# Patient Record
Sex: Female | Born: 1985 | Marital: Single | Smoking: Never smoker
Health system: Southern US, Community
[De-identification: ages and names within clinical notes are randomized; demographics above are authoritative.]

## PROBLEM LIST (undated history)

## (undated) DIAGNOSIS — B2790 Infectious mononucleosis, unspecified without complication: Secondary | ICD-10-CM

## (undated) DIAGNOSIS — N200 Calculus of kidney: Secondary | ICD-10-CM

## (undated) DIAGNOSIS — M159 Polyosteoarthritis, unspecified: Secondary | ICD-10-CM

## (undated) HISTORY — PX: NEPHROSTOMY: SHX1014

## (undated) HISTORY — DX: Infectious mononucleosis, unspecified without complication: B27.90

## (undated) HISTORY — PX: CHOLECYSTECTOMY: SHX55

## (undated) HISTORY — DX: Polyosteoarthritis, unspecified: M15.9

## (undated) HISTORY — DX: Calculus of kidney: N20.0

---

## 2005-04-08 DIAGNOSIS — B2790 Infectious mononucleosis, unspecified without complication: Secondary | ICD-10-CM

## 2005-04-08 HISTORY — DX: Infectious mononucleosis, unspecified without complication: B27.90

## 2005-04-20 ENCOUNTER — Ambulatory Visit: Payer: Self-pay | Admitting: Family Medicine

## 2005-04-23 ENCOUNTER — Ambulatory Visit: Payer: Self-pay | Admitting: Family Medicine

## 2005-07-05 ENCOUNTER — Ambulatory Visit: Payer: Self-pay | Admitting: Family Medicine

## 2005-09-06 ENCOUNTER — Encounter: Payer: Self-pay | Admitting: Family Medicine

## 2005-09-11 ENCOUNTER — Ambulatory Visit: Payer: Self-pay | Admitting: Family Medicine

## 2005-09-11 ENCOUNTER — Encounter: Payer: Self-pay | Admitting: Family Medicine

## 2005-09-11 ENCOUNTER — Other Ambulatory Visit: Admission: RE | Admit: 2005-09-11 | Discharge: 2005-09-11 | Payer: Self-pay | Admitting: Family Medicine

## 2006-02-21 ENCOUNTER — Ambulatory Visit: Payer: Self-pay | Admitting: Family Medicine

## 2006-03-08 ENCOUNTER — Ambulatory Visit: Payer: Self-pay | Admitting: Family Medicine

## 2006-04-16 DIAGNOSIS — N943 Premenstrual tension syndrome: Secondary | ICD-10-CM | POA: Insufficient documentation

## 2006-04-24 ENCOUNTER — Ambulatory Visit: Payer: Self-pay | Admitting: Family Medicine

## 2006-05-24 ENCOUNTER — Encounter: Payer: Self-pay | Admitting: Family Medicine

## 2006-07-04 ENCOUNTER — Ambulatory Visit: Payer: Self-pay | Admitting: Family Medicine

## 2006-07-08 ENCOUNTER — Telehealth: Payer: Self-pay | Admitting: Family Medicine

## 2006-07-10 ENCOUNTER — Ambulatory Visit: Payer: Self-pay | Admitting: Family Medicine

## 2006-07-10 LAB — CONVERTED CEMR LAB
Bilirubin Urine: NEGATIVE
Glucose, Urine, Semiquant: NEGATIVE
Ketones, urine, test strip: NEGATIVE
Specific Gravity, Urine: 1.02
Urobilinogen, UA: 0.2
pH: 8

## 2006-07-11 ENCOUNTER — Telehealth: Payer: Self-pay | Admitting: Family Medicine

## 2006-07-12 ENCOUNTER — Telehealth (INDEPENDENT_AMBULATORY_CARE_PROVIDER_SITE_OTHER): Payer: Self-pay | Admitting: *Deleted

## 2006-08-23 ENCOUNTER — Ambulatory Visit: Payer: Self-pay | Admitting: Family Medicine

## 2006-11-26 ENCOUNTER — Ambulatory Visit: Payer: Self-pay | Admitting: Family Medicine

## 2006-11-26 ENCOUNTER — Encounter: Payer: Self-pay | Admitting: Family Medicine

## 2006-11-26 ENCOUNTER — Other Ambulatory Visit: Admission: RE | Admit: 2006-11-26 | Discharge: 2006-11-26 | Payer: Self-pay | Admitting: Family Medicine

## 2006-12-03 ENCOUNTER — Telehealth (INDEPENDENT_AMBULATORY_CARE_PROVIDER_SITE_OTHER): Payer: Self-pay | Admitting: *Deleted

## 2006-12-20 ENCOUNTER — Telehealth (INDEPENDENT_AMBULATORY_CARE_PROVIDER_SITE_OTHER): Payer: Self-pay | Admitting: *Deleted

## 2007-01-15 ENCOUNTER — Ambulatory Visit: Payer: Self-pay | Admitting: Family Medicine

## 2007-01-15 DIAGNOSIS — K224 Dyskinesia of esophagus: Secondary | ICD-10-CM

## 2007-01-31 ENCOUNTER — Telehealth: Payer: Self-pay | Admitting: Family Medicine

## 2007-11-20 ENCOUNTER — Telehealth: Payer: Self-pay | Admitting: Family Medicine

## 2007-12-08 ENCOUNTER — Ambulatory Visit: Payer: Self-pay | Admitting: Family Medicine

## 2007-12-08 ENCOUNTER — Encounter: Payer: Self-pay | Admitting: Family Medicine

## 2007-12-08 ENCOUNTER — Other Ambulatory Visit: Admission: RE | Admit: 2007-12-08 | Discharge: 2007-12-08 | Payer: Self-pay | Admitting: Family Medicine

## 2007-12-11 LAB — CONVERTED CEMR LAB: Pap Smear: NORMAL

## 2008-02-04 ENCOUNTER — Telehealth: Payer: Self-pay | Admitting: Family Medicine

## 2008-04-13 ENCOUNTER — Telehealth: Payer: Self-pay | Admitting: Family Medicine

## 2008-04-14 ENCOUNTER — Ambulatory Visit: Payer: Self-pay | Admitting: Family Medicine

## 2008-04-14 DIAGNOSIS — N3000 Acute cystitis without hematuria: Secondary | ICD-10-CM | POA: Insufficient documentation

## 2008-04-14 LAB — CONVERTED CEMR LAB
Bilirubin Urine: NEGATIVE
Blood in Urine, dipstick: NEGATIVE
Glucose, Urine, Semiquant: NEGATIVE
Ketones, urine, test strip: NEGATIVE
Protein, U semiquant: NEGATIVE
Urobilinogen, UA: NEGATIVE
pH: 5.5

## 2009-01-24 ENCOUNTER — Encounter: Payer: Self-pay | Admitting: Family Medicine

## 2009-01-24 ENCOUNTER — Other Ambulatory Visit: Admission: RE | Admit: 2009-01-24 | Discharge: 2009-01-24 | Payer: Self-pay | Admitting: Family Medicine

## 2009-01-24 ENCOUNTER — Ambulatory Visit: Payer: Self-pay | Admitting: Family Medicine

## 2009-01-25 LAB — CONVERTED CEMR LAB
ALT: 8 units/L (ref 0–35)
Albumin: 4.1 g/dL (ref 3.5–5.2)
Alkaline Phosphatase: 45 units/L (ref 39–117)
CO2: 23 meq/L (ref 19–32)
Cholesterol: 149 mg/dL (ref 0–200)
Glucose, Bld: 95 mg/dL (ref 70–99)
LDL Cholesterol: 43 mg/dL (ref 0–99)
Potassium: 4.5 meq/L (ref 3.5–5.3)
Sodium: 138 meq/L (ref 135–145)
Total Bilirubin: 0.5 mg/dL (ref 0.3–1.2)
Total Protein: 6.8 g/dL (ref 6.0–8.3)
Triglycerides: 168 mg/dL — ABNORMAL HIGH (ref <150)
VLDL: 34 mg/dL (ref 0–40)

## 2009-04-05 ENCOUNTER — Ambulatory Visit: Payer: Self-pay | Admitting: Family Medicine

## 2009-04-05 DIAGNOSIS — J029 Acute pharyngitis, unspecified: Secondary | ICD-10-CM

## 2009-04-05 LAB — CONVERTED CEMR LAB
Basophils Absolute: 0 10*3/uL (ref 0.0–0.1)
Basophils Relative: 0 % (ref 0–1)
Eosinophils Absolute: 0 10*3/uL (ref 0.0–0.7)
Eosinophils Relative: 0 % (ref 0–5)
HCT: 39.5 % (ref 36.0–46.0)
Hemoglobin: 13.7 g/dL (ref 12.0–15.0)
Lymphocytes Relative: 43 % (ref 12–46)
Lymphs Abs: 2.1 10*3/uL (ref 0.7–4.0)
MCHC: 34.7 g/dL (ref 30.0–36.0)
MCV: 86.4 fL (ref 78.0–100.0)
Monocytes Absolute: 0.2 10*3/uL (ref 0.1–1.0)
Monocytes Relative: 4 % (ref 3–12)
Neutro Abs: 2.5 10*3/uL (ref 1.7–7.7)
Neutrophils Relative %: 53 % (ref 43–77)
Platelets: 268 10*3/uL (ref 150–400)
RBC: 4.57 M/uL (ref 3.87–5.11)
RDW: 11.3 % — ABNORMAL LOW (ref 11.5–15.5)
WBC: 4.8 10*3/uL (ref 4.0–10.5)

## 2009-04-06 ENCOUNTER — Encounter: Payer: Self-pay | Admitting: Family Medicine

## 2009-04-06 LAB — CONVERTED CEMR LAB
ALT: 17 units/L (ref 0–35)
AST: 18 units/L (ref 0–37)
Albumin: 3.8 g/dL (ref 3.5–5.2)
CO2: 25 meq/L (ref 19–32)
Calcium: 9 mg/dL (ref 8.4–10.5)
Chloride: 102 meq/L (ref 96–112)
Potassium: 4.7 meq/L (ref 3.5–5.3)
Sodium: 139 meq/L (ref 135–145)
Total Protein: 6.6 g/dL (ref 6.0–8.3)

## 2009-09-01 ENCOUNTER — Ambulatory Visit: Payer: Self-pay | Admitting: Family Medicine

## 2009-09-01 DIAGNOSIS — R1084 Generalized abdominal pain: Secondary | ICD-10-CM | POA: Insufficient documentation

## 2009-09-01 LAB — CONVERTED CEMR LAB
Blood in Urine, dipstick: NEGATIVE
Glucose, Urine, Semiquant: NEGATIVE
Nitrite: NEGATIVE
Specific Gravity, Urine: 1.02
WBC Urine, dipstick: NEGATIVE

## 2010-02-15 ENCOUNTER — Other Ambulatory Visit: Admission: RE | Admit: 2010-02-15 | Discharge: 2010-02-15 | Payer: Self-pay | Admitting: Family Medicine

## 2010-02-15 ENCOUNTER — Ambulatory Visit: Payer: Self-pay | Admitting: Family Medicine

## 2010-02-18 ENCOUNTER — Telehealth: Payer: Self-pay | Admitting: Family Medicine

## 2010-08-08 NOTE — Progress Notes (Signed)
Summary: change triptan  Phone Note Outgoing Call   Summary of Call: Pls let pt know that her TREXIMET is not covered by her insurance unless she have failed a number of other migraine meds.  I am going to change her to Maxalt MLT to use in it's place.  I will send new Rx to her pharmacy.  Let me know if any problems.   Initial call taken by: Seymour Bars DO,  February 18, 2010 2:57 PM    New/Updated Medications: MAXALT-MLT 10 MG TBDP (RIZATRIPTAN BENZOATE) 1 tab by mouth x 1; repeat in 2 hrs if needed for migraine. Prescriptions: MAXALT-MLT 10 MG TBDP (RIZATRIPTAN BENZOATE) 1 tab by mouth x 1; repeat in 2 hrs if needed for migraine.  #9 tabs x 2   Entered and Authorized by:   Seymour Bars DO   Signed by:   Seymour Bars DO on 02/18/2010   Method used:   Electronically to        [facility]       [address]       [place], [place]  [address]       Ph: [phone]       Fax: [phone]   RxID:   [id number]   Appended Document: change triptan LMOM for Pt to CB.Arvilla Market CMA, Michelle February 20, 2010 11:27 AM   PAP Result:  normal PAP Next Due:  1 yr Pls let pt know that her pap smear came back normal and her gonorrhea and chlamydia were both negative.  Seymour Bars, D.O.   Pt aware of the above.Arvilla Market CMA, Michelle February 20, 2010 3:36 PM
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
## 2010-08-08 NOTE — Assessment & Plan Note (Signed)
Summary: CPE with pap   Vital Signs:  Patient profile:   25 year old female Menstrual status:  regular Height:      66 inches Weight:      141 pounds BMI:     22.84 O2 Sat:      100 % on Room air Pulse rate:   85 / minute BP sitting:   109 / 76  (left arm) Cuff size:   regular  Vitals Entered By: Payton Spark CMA (February 15, 2010 8:32 AM)  O2 Flow:  Room air CC: CPE w/ pap   Primary Care Cecelia Graciano:  Seymour Bars D.O.  CC:  CPE w/ pap.  History of Present Illness: 25 yo WF presents for CPE with pap smear.  She has been on Yaz.  Her periods are lasting 6  days and her PMS has been getting.  worse.  She is not desiring pregnancy at this time.  She rarely used Xanax for anxiety.  She feels too tired when she has to take it.    She is monogamous w/ her boyfriend.  Had normal labs last year.  She denies fam hx of breast or colon cancer or of premature heart dz.  She has lost 9 lbs.  She is eating healthier and exercising more.  She does not smoke.   Current Medications (verified): 1)  Yaz 3-0.02 Mg Tabs (Drospirenone-Ethinyl Estradiol) .... Take 1 Tablet By Mouth Once A Day 2)  Treximet 85-500 Mg  Tabs (Sumatriptan-Naproxen Sodium) .Marland Kitchen.. 1 By Mouth Once Daily As Needed Migraine. Can Repeat Dose in 2 Hours If Needed. 3)  Alprazolam 0.25 Mg Tabs (Alprazolam) .Marland Kitchen.. 1 Tab By Mouth Once Daily As Needed For Anxiety 4)  Phentermine Hcl 37.5 Mg Caps (Phentermine Hcl) .... Take 1 Cap By Mouth Once Daily  Allergies (verified): No Known Drug Allergies  Past History:  Past Medical History: Reviewed history from 01/24/2009 and no changes required. 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   Living in [place], alone.     Parents live in [place].  Sexually active..  Non smoker, occas ETOH.  runs 2-3 x a wk.   Has a boyfriend.  Review of Systems  The patient denies anorexia, fever, weight loss, weight gain, vision loss, decreased  hearing, hoarseness, chest pain, syncope, dyspnea on exertion, peripheral edema, prolonged cough, headaches, hemoptysis, abdominal pain, melena, hematochezia, severe indigestion/heartburn, hematuria, incontinence, genital sores, muscle weakness, suspicious skin lesions, transient blindness, difficulty walking, depression, unusual weight change, abnormal bleeding, enlarged lymph nodes, angioedema, breast masses, and testicular masses.    Physical Exam  General:  alert, well-developed, well-nourished, and well-hydrated.   Head:  normocephalic and atraumatic.   Eyes:  pupils equal, pupils round, and pupils reactive to light.   Ears:  no external deformities.   Nose:  no nasal discharge.   Mouth:  good dentition and pharynx pink and moist.   Neck:  no masses.   Breasts:  No mass, nodules, thickening, tenderness, bulging, retraction, inflamation, nipple discharge or skin changes noted.   Lungs:  Normal respiratory effort, chest expands symmetrically. Lungs are clear to auscultation, no crackles or wheezes. Heart:  Normal rate and regular rhythm. S1 and S2 normal without gallop, murmur, click, rub or other extra sounds. Abdomen:  soft, non-tender, normal bowel sounds, no distention, no masses, no guarding, no hepatomegaly, and no splenomegaly.   Genitalia:  Pelvic Exam:  External: normal female genitalia without lesions or masses        Vagina: normal without lesions or masses        Cervix: normal without lesions or masses, + nabothian cysts        Adnexa: normal bimanual exam without masses or fullness        Uterus: normal by palpation        Pap smear: performed   Impression & Recommendations:  Problem # 1:  ROUTINE GYNECOLOGICAL EXAMINATION (ICD-V72.31) Keeping healthy checklist for women reviewed. BP and BMI at goal. Fasting labs and immunizations are UTD. Continue Yaz for now but will refer to gyn down the hall to ask about Mirena IUD. Change sparing use of Xanax to Ativan due  to sedation. Work on [employer], regular exercise, daily MVI. Declined STD testing.  Complete Medication List: 1)  Yaz 3-0.02 Mg Tabs (Drospirenone-ethinyl estradiol) .... Take 1 tablet by mouth once a day 2)  Treximet 85-500 Mg Tabs (Sumatriptan-naproxen sodium) .Marland Kitchen.. 1 by mouth once daily as needed migraine. can repeat dose in 2 hours if needed. 3)  Lorazepam 0.5 Mg Tabs (Lorazepam) .Marland Kitchen.. 1 tab by mouth daily as needed for anxiety  Other Orders: Gynecologic Referral (Gyn)  Patient Instructions: 1)  Referral made to gyn down the hall re: ? Mirena placement. 2)  Stay on YAZ for now.   3)  Will call you with pap smear results in the next 5 days. 4)  Change Alprazolam to Lorazepam -- cut in 1/2 and use as needed for anxiety. 5)  Return for follow up migraine/ anxiety in 6 mos. Prescriptions: TREXIMET 85-500 MG  TABS (SUMATRIPTAN-NAPROXEN SODIUM) 1 by mouth once daily as needed migraine. Can repeat dose in 2 hours if needed.  #9 tabs x 2   Entered and Authorized by:   Seymour Bars DO   Signed by:   Seymour Bars DO on 02/15/2010   Method used:   Electronically to        [facility]       [address]       [place], [place]  [address]       Ph: [phone]       Fax: [phone]   RxID:   [id number] LORAZEPAM 0.5 MG TABS (LORAZEPAM) 1 tab by mouth daily as needed for anxiety  #30 x 0   Entered and Authorized by:   Seymour Bars DO   Signed by:   Seymour Bars DO on 02/15/2010   Method used:   Printed then faxed to ...       [facility] [address] (retail)       [address]       [place], [place]  [address]       Ph: [phone]       Fax: [phone]   RxID:   [id number]

## 2011-01-26 ENCOUNTER — Other Ambulatory Visit: Payer: Self-pay | Admitting: Family Medicine

## 2011-02-04 ENCOUNTER — Encounter: Payer: Self-pay | Admitting: Family Medicine

## 2011-02-05 ENCOUNTER — Encounter: Payer: Self-pay | Admitting: Family Medicine

## 2011-02-07 ENCOUNTER — Encounter: Payer: Self-pay | Admitting: Family Medicine

## 2011-02-07 ENCOUNTER — Encounter: Payer: BC Managed Care – PPO | Admitting: Family Medicine

## 2011-02-07 NOTE — Progress Notes (Signed)
  Subjective:    Patient ID: Tara Mcclain, female    DOB: [birth year], 25 y.o.   MRN: [id number]  HPI  Error.  Pt to early for CPE with pap and had to r/s.    Review of Systems     Objective:   Physical Exam        Assessment & Plan:

## 2011-02-21 ENCOUNTER — Encounter: Payer: Self-pay | Admitting: Family Medicine

## 2011-02-27 ENCOUNTER — Encounter: Payer: BC Managed Care – PPO | Admitting: Family Medicine

## 2011-07-12 ENCOUNTER — Other Ambulatory Visit (HOSPITAL_COMMUNITY)
Admission: RE | Admit: 2011-07-12 | Discharge: 2011-07-12 | Disposition: A | Discharge status: Home / Self Care | Payer: BC Managed Care – PPO | Source: Ambulatory Visit | Attending: Family Medicine | Admitting: Family Medicine

## 2011-07-12 ENCOUNTER — Encounter: Payer: Self-pay | Admitting: Family Medicine

## 2011-07-12 ENCOUNTER — Ambulatory Visit (INDEPENDENT_AMBULATORY_CARE_PROVIDER_SITE_OTHER): Payer: BC Managed Care – PPO | Admitting: Family Medicine

## 2011-07-12 DIAGNOSIS — Z01419 Encounter for gynecological examination (general) (routine) without abnormal findings: Secondary | ICD-10-CM | POA: Insufficient documentation

## 2011-07-12 DIAGNOSIS — Z124 Encounter for screening for malignant neoplasm of cervix: Secondary | ICD-10-CM

## 2011-07-12 MED ORDER — LORAZEPAM 0.5 MG PO TABS: 0.5000 mg | ORAL_TABLET | Freq: Every day | ORAL | Status: DC | PRN

## 2011-07-12 MED ORDER — SUMATRIPTAN SUCCINATE 100 MG PO TABS: 100.0000 mg | ORAL_TABLET | ORAL | Status: DC | PRN

## 2011-07-12 NOTE — Patient Instructions (Signed)
Start a regular exercise program and make sure you are eating a healthy diet Try to eat 4 servings of dairy a day or take a calcium supplement (500mg twice a day). Your vaccines are up to date.   

## 2011-07-12 NOTE — Progress Notes (Signed)
  Subjective:     Tara Mcclain is a 26 y.o. female and is here for a comprehensive physical exam. The patient reports no problems.  History   Social History  . Marital Status: Single    Spouse Name: Josh    Number of Children: N/A  . Years of Education: N/A   Occupational History  . 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 Smoking status: Never Smoker   . Smokeless tobacco: Not on file  . Alcohol Use: 0.5 oz/week    1 drink(s) per week     occasional  . Drug Use: Not on file  . Sexually Active: Yes -- Female partner(s)   Other Topics Concern  . Not on file   Social History Narrative   Daily caffeine. No regular exercise.    Health Maintenance  Topic Date Due  . Influenza Vaccine  04/08/2012  . Pap Smear  07/11/2014  . Tetanus/tdap  02/13/2016    The following portions of the patient's history were reviewed and updated as appropriate: allergies, current medications, past family history, past medical history, past social history, past surgical history and problem list.  Review of Systems A comprehensive review of systems was negative.   Objective:    BP 98/58  Pulse 91  Wt 159 lb (72.122 kg)  LMP 06/21/2011 General appearance: alert, cooperative and appears stated age Head: Normocephalic, without obvious abnormality, atraumatic Eyes: conj clear, EOMi, PEERLA Ears: normal TM's and external ear canals both ears Nose: Nares normal. Septum midline. Mucosa normal. No drainage or sinus tenderness. Throat: lips, mucosa, and tongue normal; teeth and gums normal Neck: no adenopathy, no carotid bruit, no JVD, supple, symmetrical, trachea midline and thyroid not enlarged, symmetric, no tenderness/mass/nodules Back: no kyphosis present, symmetric, no curvature. ROM normal. No CVA tenderness. Lungs: clear to auscultation bilaterally Breasts: normal appearance, no masses or tenderness Heart: regular rate and rhythm, S1, S2 normal, no murmur, click, rub or  gallop Abdomen: soft, non-tender; bowel sounds normal; no masses,  no organomegaly Pelvic: cervix normal in appearance, external genitalia normal, no adnexal masses or tenderness, no cervical motion tenderness, rectovaginal septum normal, uterus normal size, shape, and consistency, vagina normal without discharge and cervical mucocele Extremities: extremities normal, atraumatic, no cyanosis or edema Pulses: 2+ and symmetric Skin: Skin color, texture, turgor normal. No rashes or lesions Lymph nodes: Cervical, supraclavicular, and axillary nodes normal. Neurologic: Grossly normal    Assessment:    Healthy female exam.      Plan:     See After Visit Summary for Counseling Recommendations  Start a regular exercise program and make sure you are eating a healthy diet Try to eat 4 servings of dairy a day or take a calcium supplement (500mg  twice a day). Your vaccines are up to date.  Screening labs slip given.

## 2011-07-18 LAB — VITAMIN D 25 HYDROXY (VIT D DEFICIENCY, FRACTURES): Vit D, 25-Hydroxy: 52 ng/mL (ref 30–89)

## 2011-07-18 LAB — COMPLETE METABOLIC PANEL WITH GFR
Albumin: 3.8 g/dL (ref 3.5–5.2)
BUN: 12 mg/dL (ref 6–23)
CO2: 26 mEq/L (ref 19–32)
Calcium: 9.2 mg/dL (ref 8.4–10.5)
Chloride: 103 mEq/L (ref 96–112)
GFR, Est African American: >89 mL/min
GFR, Est Non African American: >89 mL/min
Glucose, Bld: 92 mg/dL (ref 70–99)
Potassium: 4.5 mEq/L (ref 3.5–5.3)
Sodium: 139 mEq/L (ref 135–145)
Total Protein: 6.4 g/dL (ref 6.0–8.3)

## 2011-07-18 LAB — LIPID PANEL: Total CHOL/HDL Ratio: 2.3 Ratio

## 2011-11-05 ENCOUNTER — Other Ambulatory Visit: Payer: Self-pay | Admitting: Family Medicine

## 2012-07-29 ENCOUNTER — Other Ambulatory Visit (HOSPITAL_COMMUNITY)
Admission: RE | Admit: 2012-07-29 | Discharge: 2012-07-29 | Disposition: A | Discharge status: Home / Self Care | Payer: BC Managed Care – PPO | Source: Ambulatory Visit | Attending: Family Medicine | Admitting: Family Medicine

## 2012-07-29 ENCOUNTER — Encounter: Payer: Self-pay | Admitting: Family Medicine

## 2012-07-29 ENCOUNTER — Ambulatory Visit (INDEPENDENT_AMBULATORY_CARE_PROVIDER_SITE_OTHER): Payer: BC Managed Care – PPO | Admitting: Family Medicine

## 2012-07-29 VITALS — BP 121/71 | HR 83 | Resp 18 | Ht 65.5 in | Wt 182.0 lb

## 2012-07-29 DIAGNOSIS — R3129 Other microscopic hematuria: Secondary | ICD-10-CM

## 2012-07-29 DIAGNOSIS — R635 Abnormal weight gain: Secondary | ICD-10-CM

## 2012-07-29 DIAGNOSIS — L709 Acne, unspecified: Secondary | ICD-10-CM

## 2012-07-29 DIAGNOSIS — Z01419 Encounter for gynecological examination (general) (routine) without abnormal findings: Secondary | ICD-10-CM | POA: Insufficient documentation

## 2012-07-29 DIAGNOSIS — N979 Female infertility, unspecified: Secondary | ICD-10-CM

## 2012-07-29 DIAGNOSIS — L708 Other acne: Secondary | ICD-10-CM

## 2012-07-29 DIAGNOSIS — R32 Unspecified urinary incontinence: Secondary | ICD-10-CM

## 2012-07-29 LAB — COMPLETE METABOLIC PANEL WITH GFR
ALT: 12 U/L (ref 0–35)
AST: 13 U/L (ref 0–37)
Alkaline Phosphatase: 63 U/L (ref 39–117)
BUN: 15 mg/dL (ref 6–23)
Creat: 0.65 mg/dL (ref 0.50–1.10)

## 2012-07-29 LAB — LUTEINIZING HORMONE: LH: 7 m[IU]/mL

## 2012-07-29 LAB — TSH: TSH: 1.399 u[IU]/mL (ref 0.350–4.500)

## 2012-07-29 LAB — POCT URINALYSIS DIPSTICK
Glucose, UA: NEGATIVE
Nitrite, UA: NEGATIVE
Protein, UA: NEGATIVE
Spec Grav, UA: 1.025
Urobilinogen, UA: 0.2
pH, UA: 6

## 2012-07-29 LAB — PROGESTERONE: Progesterone: 12.4 ng/mL

## 2012-07-29 LAB — LIPID PANEL
Cholesterol: 150 mg/dL (ref 0–200)
HDL: 67 mg/dL (ref >39)
LDL Cholesterol: 68 mg/dL (ref 0–99)
Total CHOL/HDL Ratio: 2.2 Ratio
Triglycerides: 75 mg/dL (ref <150)
VLDL: 15 mg/dL (ref 0–40)

## 2012-07-29 LAB — ESTRADIOL: Estradiol: 120.6 pg/mL

## 2012-07-29 LAB — FOLLICLE STIMULATING HORMONE: FSH: 5.1 m[IU]/mL

## 2012-07-29 NOTE — Patient Instructions (Addendum)
Keep up a regular exercise program and make sure you are eating a healthy diet Try to eat 4 servings of dairy a day, or if you are lactose intolerant take a calcium with vitamin D daily.  Your vaccines are up to date.   

## 2012-07-29 NOTE — Progress Notes (Signed)
Subjective:     Tara Mcclain is a 27 y.o. female and is here for a comprehensive physical exam. The patient reports problems - has been trying to get pregnant for 7 months. Says periods are light but they are regular.  She has been using LH kits. Most of them have been showing positive around 2 weeks after she finishes her period that she has a couple times where it should she was having an LH surge right after her period. She's to have really heavy periods before she started birth control. But she has been on birth control for at least 4-5 years at this point. She has been taking a prenatal vitamin. She's also complaining of increased weight gain since coming off of the birth control. She recently started exercising again and has been trying to control her diet by reducing her calories to 1200 a day. Unfortunately still continues to gain. She does have a history of hyperthyroidism years ago when she was a child. She has not had a thyroid level rechecked recently. She's also complaining of some occasional incontinence that started over the last year. No dysuria or fever.  She's also noticed more acne since stopping the pill.  History   Social History  . Marital Status: Single    Spouse Name: Josh    Number of Children: N/A  . Years of Education: N/A   Occupational History  . 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 Smoking status: Never Smoker   . Smokeless tobacco: Not on file  . Alcohol Use: 0.5 oz/week    1 drink(s) per week     Comment: occasional  . Drug Use: Not on file  . Sexually Active: Yes -- Female partner(s)   Other Topics Concern  . Not on file   Social History Narrative   Daily caffeine.  Some regular exercise.    Health Maintenance  Topic Date Due  . Pap Smear  07/11/2014  . Tetanus/tdap  02/13/2016  . Influenza Vaccine  03/09/2012    The following portions of the patient's history were reviewed and updated as appropriate: allergies, current  medications, past family history, past medical history, past social history, past surgical history and problem list.  Review of Systems A comprehensive review of systems was negative.   Objective:    BP 121/71  Pulse 83  Resp 18  Ht 5' 5.5" (1.664 m)  Wt 182 lb (82.555 kg)  BMI 29.83 kg/m2  SpO2 98%  LMP 07/10/2012 General appearance: alert, cooperative and appears stated age Head: Normocephalic, without obvious abnormality, atraumatic Eyes: conj clear, EOMi, PEERLA Ears: normal TM's and external ear canals both ears Nose: Nares normal. Septum midline. Mucosa normal. No drainage or sinus tenderness. Throat: lips, mucosa, and tongue normal; teeth and gums normal Neck: no adenopathy, no carotid bruit, no JVD, supple, symmetrical, trachea midline and thyroid not enlarged, symmetric, no tenderness/mass/nodules Back: symmetric, no curvature. ROM normal. No CVA tenderness. Lungs: clear to auscultation bilaterally Breasts: normal appearance, no masses or tenderness Heart: regular rate and rhythm, S1, S2 normal, no murmur, click, rub or gallop Abdomen: soft, non-tender; bowel sounds normal; no masses,  no organomegaly Pelvic: external genitalia normal, no adnexal masses or tenderness, no cervical motion tenderness, rectovaginal septum normal, uterus normal size, shape, and consistency, vagina normal without discharge and cervix is erythematous and easily friable Extremities: extremities normal, atraumatic, no cyanosis or edema Pulses: 2+ and symmetric Skin: Skin color, texture,  turgor normal. No rashes or lesions Lymph nodes: Cervical, supraclavicular, and axillary nodes normal. Neurologic: Alert and oriented X 3, normal strength and tone. Normal symmetric reflexes. Normal coordination and gait    Assessment:    Healthy female exam.      Plan:     See After Visit Summary for Counseling Recommendations  Keep up a regular exercise program and make sure you are eating a healthy  diet Try to eat 4 servings of dairy a day, or if you are lactose intolerant take a calcium with vitamin D daily.  Your vaccines are up to date.   Abnormal weight gain-I like to check a TSH especially with known history of hyperthyroidism years ago as a child. Often times these patients become hypothyroid later down the road. Continue with her current headache calorie diet in addition to regular exercise. She was interested in phentermine which is taken in the past but I recommended that she avoid this drug while she is trying to get pregnant. She is to focus primarily on diet and exercise.  Fertility problems-continue with prenatal vitamin. Periods have been regular since stopping birth control which is reassuring. REFER her to OB/GYN for further evaluation the typically they don't do a full workup until she has had in fertility for a year. Encouraged her to continue with [facility] kits if these are helpful. This is stressing her out more than she can stop them. We will check hormone levels today. We'll followup Pap smear results.Gave reassurance.  Acne-could be worse because she stopped her birth control pill. Explained her that most of control pills are actually help acne. Also consider that she could have PCO S. especially with her history of weight gain and acne. She has never been diagnosed with this before.  Incontinence - will check urinalysis to rule out UTI. UA + for blood will send for culture.

## 2012-07-31 ENCOUNTER — Telehealth: Payer: Self-pay

## 2012-07-31 NOTE — Telephone Encounter (Signed)
Left message for patient to call to set up appointment was referred by Dr. Linford Arnold

## 2012-08-01 LAB — URINE CULTURE

## 2012-12-30 ENCOUNTER — Other Ambulatory Visit: Payer: Self-pay | Admitting: *Deleted

## 2012-12-30 MED ORDER — LORAZEPAM 0.5 MG PO TABS: 0.5000 mg | ORAL_TABLET | Freq: Every day | ORAL | Status: DC | PRN

## 2013-04-08 DIAGNOSIS — N200 Calculus of kidney: Secondary | ICD-10-CM

## 2013-04-08 HISTORY — DX: Calculus of kidney: N20.0

## 2013-04-21 ENCOUNTER — Encounter: Payer: Self-pay | Admitting: Family Medicine

## 2013-04-21 ENCOUNTER — Ambulatory Visit (INDEPENDENT_AMBULATORY_CARE_PROVIDER_SITE_OTHER): Payer: BC Managed Care – PPO | Admitting: Family Medicine

## 2013-04-21 VITALS — BP 125/73 | HR 88 | Wt 184.0 lb

## 2013-04-21 DIAGNOSIS — Z0100 Encounter for examination of eyes and vision without abnormal findings: Secondary | ICD-10-CM

## 2013-04-21 DIAGNOSIS — N200 Calculus of kidney: Secondary | ICD-10-CM | POA: Insufficient documentation

## 2013-04-21 DIAGNOSIS — H579 Unspecified disorder of eye and adnexa: Secondary | ICD-10-CM

## 2013-04-21 NOTE — Progress Notes (Signed)
[name] test for Colour Deficiency performed and passed.Tara Mcclain [name]

## 2013-04-21 NOTE — Progress Notes (Signed)
  Subjective:    Patient ID: Tara Mcclain, female    DOB: 27-Oct-1985, 27 y.o.   MRN: [id number]  HPI She is here today to have form completed. 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 She was seen in January for complete physical. She has no history of any musculoskeletal problems or visual problems that should affect her ability to drive. Form completed. Patient to keep her regularly scheduled followup appointment in January for her wellness exam.   Review of Systems     Objective:   Physical Exam        Assessment & Plan:

## 2013-05-11 ENCOUNTER — Encounter: Payer: Self-pay | Admitting: Family Medicine

## 2013-09-18 ENCOUNTER — Encounter: Payer: Self-pay | Admitting: Family Medicine

## 2014-02-01 ENCOUNTER — Other Ambulatory Visit: Payer: Self-pay | Admitting: Family Medicine

## 2014-04-05 ENCOUNTER — Ambulatory Visit (INDEPENDENT_AMBULATORY_CARE_PROVIDER_SITE_OTHER): Payer: BC Managed Care – PPO | Admitting: Family Medicine

## 2014-04-05 ENCOUNTER — Encounter: Payer: Self-pay | Admitting: Family Medicine

## 2014-04-05 VITALS — BP 126/86 | HR 75 | Temp 98.8°F | Wt 201.0 lb

## 2014-04-05 DIAGNOSIS — L03818 Cellulitis of other sites: Secondary | ICD-10-CM

## 2014-04-05 DIAGNOSIS — L02818 Cutaneous abscess of other sites: Secondary | ICD-10-CM | POA: Diagnosis not present

## 2014-04-05 MED ORDER — LORAZEPAM 0.5 MG PO TABS: 0.5000 mg | ORAL_TABLET | Freq: Every day | ORAL | Status: DC | PRN

## 2014-04-05 MED ORDER — DOXYCYCLINE HYCLATE 100 MG PO TABS: 100.0000 mg | ORAL_TABLET | Freq: Two times a day (BID) | ORAL | Status: DC

## 2014-04-05 NOTE — Addendum Note (Signed)
Addended by: Deno Etienne on: 04/05/2014 03:02 PM   Modules accepted: Orders

## 2014-04-05 NOTE — Patient Instructions (Signed)

## 2014-04-05 NOTE — Progress Notes (Signed)
   Subjective:    Patient ID: Tara Mcclain, female    DOB: Nov 15, 1985, 28 y.o.   MRN: [id number]  HPI 5 days ago noticed a fliud filled bump on her right hip.  Says she "popped it" . No fever or chills.  Not sure where got it.  She is still nursing.     Review of Systems     Objective:   Physical Exam  Skin:  Right hip with 4 x 7cm oval of erythema and increased warmth as well. No active drainage. Small pinpoint at the center.          Assessment & Plan:  Cellulitis - will tx with doxy ot cover MRSA.  Safe overall for nursing per "Drugs During Pregnancy and Lactation"

## 2014-04-28 ENCOUNTER — Other Ambulatory Visit: Payer: Self-pay | Admitting: Family Medicine

## 2014-04-28 ENCOUNTER — Encounter: Payer: Self-pay | Admitting: Family Medicine

## 2014-04-28 ENCOUNTER — Ambulatory Visit (INDEPENDENT_AMBULATORY_CARE_PROVIDER_SITE_OTHER): Payer: BC Managed Care – PPO | Admitting: Family Medicine

## 2014-04-28 VITALS — BP 129/87 | HR 94 | Temp 98.6°F | Ht 65.5 in | Wt 200.0 lb

## 2014-04-28 DIAGNOSIS — G5711 Meralgia paresthetica, right lower limb: Secondary | ICD-10-CM | POA: Diagnosis not present

## 2014-04-28 DIAGNOSIS — Z23 Encounter for immunization: Secondary | ICD-10-CM

## 2014-04-28 DIAGNOSIS — F411 Generalized anxiety disorder: Secondary | ICD-10-CM

## 2014-04-28 DIAGNOSIS — Z0000 Encounter for general adult medical examination without abnormal findings: Secondary | ICD-10-CM | POA: Diagnosis not present

## 2014-04-28 DIAGNOSIS — R112 Nausea with vomiting, unspecified: Secondary | ICD-10-CM | POA: Diagnosis not present

## 2014-04-28 MED ORDER — CLONAZEPAM 0.5 MG PO TABS: 0.5000 mg | ORAL_TABLET | Freq: Two times a day (BID) | ORAL | Status: DC | PRN

## 2014-04-28 MED ORDER — SERTRALINE HCL 50 MG PO TABS: ORAL_TABLET | ORAL | Status: DC

## 2014-04-28 NOTE — Progress Notes (Signed)
Subjective:     Tara Mcclain is a 28 y.o. female and is here for a comprehensive physical exam. The patient reports problems - She is concerned about her gallbladder.. She says she's had problems on and off since she was 11. She says typically if she eats something greasy or really heavy or fatty she will actually wake up in the middle of night and felt nauseated and vomit. It's been happening about 3-4 times per week over the last 6 months. She's been told in the past it was reflux related to try reflux medicines but it did not resolve person comes. She does have some epigastric pain when happens. She's never had a previous evaluation for her gallbladder.  Anxiety-her anxiety levels have been very high over the last several months. She has a new job, and a baby, she's breast-feeding, and not being able to sleep through the night because the new baby. She's actually been having to take her Ativan on this twice a day. Normally 30 tabs last her a year. Now feels like the Ativan is no longer effective. Doesn't seem to be working as well even when she takes too.  She also complains about a numb area on her right lateral thigh. She says she's and was able to draw a circle around it. Says most days it feels none and other days it is a little bit more sensitive and is painful to touch. History really painful though. She says it started around the time she was having kidney problems during her pregnancy. They felt it was not related that time. It has gotten maybe a little bit better over the last 6 months since after delivery but has not resolved. She denies any low back pain or injury to her back her spine. No weakness of the thigh.    History   Social History  . Marital Status: Single    Spouse Name: Josh    Number of Children: N/A  . Years of Education: N/A   Occupational History  . 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 Smoking status: Never Smoker   . Smokeless tobacco: Not  on file  . Alcohol Use: 0.5 oz/week    1 drink(s) per week     Comment: occasional  . Drug Use: Not on file  . Sexual Activity: Yes    Partners: Male   Other Topics Concern  . Not on file   Social History Narrative   Daily caffeine.  Some regular exercise.    Health Maintenance  Topic Date Due  . Influenza Vaccine  02/07/2015  . Pap Smear  07/30/2015  . Tetanus/tdap  02/13/2016    The following portions of the patient's history were reviewed and updated as appropriate: allergies, current medications, past family history, past medical history, past social history, past surgical history and problem list.  Review of Systems A comprehensive review of systems was negative.   Objective:    BP 129/87  Pulse 94  Temp(Src) 98.6 F (37 C)  Ht 5' 5.5" (1.664 m)  Wt 200 lb (90.719 kg)  BMI 32.76 kg/m2 General appearance: alert, cooperative and appears stated age Head: Normocephalic, without obvious abnormality, atraumatic Eyes: conj clear EOMI, PEerlA Ears: normal TM's and external ear canals both ears Nose: Nares normal. Septum midline. Mucosa normal. No drainage or sinus tenderness. Throat: lips, mucosa, and tongue normal; teeth and gums normal Neck: no adenopathy, no carotid bruit, no JVD, supple,  symmetrical, trachea midline and thyroid not enlarged, symmetric, no tenderness/mass/nodules Back: symmetric, no curvature. ROM normal. No CVA tenderness. Lungs: clear to auscultation bilaterally Heart: regular rate and rhythm, S1, S2 normal, no murmur, click, rub or gallop Abdomen: soft, non-tender; bowel sounds normal; no masses,  no organomegaly Extremities: extremities normal, atraumatic, no cyanosis or edema Pulses: 2+ and symmetric Skin: Skin color, texture, turgor normal. No rashes or lesions Lymph nodes: Cervical, supraclavicular, and axillary nodes normal. Neurologic: Alert and oriented X 3, normal strength and tone. Normal symmetric reflexes. Normal coordination and gait     Assessment:    Healthy female exam.      Plan:     See After Visit Summary for Counseling Recommendations  Keep up a regular exercise program and make sure you are eating a healthy diet Try to eat 4 servings of dairy a day, or if you are lactose intolerant take a calcium with vitamin D daily.  Your vaccines are up to date.   Vomiting with epigastric pain-will do a workup for cholecystitis. We'll start with an ultrasound. If negative then consider HIDA scan. Avoid greasy spicy foods for now.  Anxiety-uncontrolled. Discussed the importance of putting her on a controller. We'll start with sertraline which is safe during breast-feeding. We'll see her back in 3-4 weeks to make sure that she's tolerating it well. Warned about potential side effects. Can adjust this at that time.  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 Without any back pain or problems in a very localized area it is likely benign. It may improve as she continues to work on losing weight but it may not. We could certainly refer her to or sports medicine doctor for further evaluation if she would like. She will think about it and let me know.

## 2014-04-28 NOTE — Patient Instructions (Signed)
complete physical examination Keep up a regular exercise program and make sure you are eating a healthy diet Try to eat 4 servings of dairy a day, or if you are lactose intolerant take a calcium with vitamin D daily.  Your vaccines are up to date.   

## 2014-04-29 LAB — LIPID PANEL
CHOL/HDL RATIO: 2.8 ratio
Cholesterol: 177 mg/dL (ref 0–200)
HDL: 63 mg/dL (ref >39)
LDL Cholesterol: 80 mg/dL (ref 0–99)
Triglycerides: 172 mg/dL — ABNORMAL HIGH (ref <150)
VLDL: 34 mg/dL (ref 0–40)

## 2014-04-29 LAB — COMPLETE METABOLIC PANEL WITH GFR
ALBUMIN: 4.3 g/dL (ref 3.5–5.2)
ALK PHOS: 87 U/L (ref 39–117)
ALT: 17 U/L (ref 0–35)
AST: 14 U/L (ref 0–37)
BUN: 15 mg/dL (ref 6–23)
CO2: 27 mEq/L (ref 19–32)
Calcium: 9.4 mg/dL (ref 8.4–10.5)
Chloride: 100 mEq/L (ref 96–112)
Creat: 0.65 mg/dL (ref 0.50–1.10)
GFR, Est African American: >89 mL/min
Glucose, Bld: 86 mg/dL (ref 70–99)
POTASSIUM: 4.4 meq/L (ref 3.5–5.3)
SODIUM: 136 meq/L (ref 135–145)
TOTAL PROTEIN: 7 g/dL (ref 6.0–8.3)
Total Bilirubin: 0.5 mg/dL (ref 0.2–1.2)

## 2014-04-29 LAB — TSH: TSH: 0.036 u[IU]/mL — ABNORMAL LOW (ref 0.350–4.500)

## 2014-04-29 LAB — T4, FREE: Free T4: 1.82 ng/dL — ABNORMAL HIGH (ref 0.80–1.80)

## 2014-04-29 LAB — T3, FREE: T3, Free: 5.2 pg/mL — ABNORMAL HIGH (ref 2.3–4.2)

## 2014-04-30 LAB — THYROID PEROXIDASE ANTIBODY: Thyroperoxidase Ab SerPl-aCnc: 4 IU/mL (ref <9)

## 2014-05-05 ENCOUNTER — Telehealth: Payer: Self-pay | Admitting: *Deleted

## 2014-05-05 DIAGNOSIS — E0590 Thyrotoxicosis, unspecified without thyrotoxic crisis or storm: Secondary | ICD-10-CM

## 2014-05-05 NOTE — Telephone Encounter (Signed)
Referral to endocrinologist placed. 

## 2014-05-10 ENCOUNTER — Encounter: Payer: Self-pay | Admitting: Family Medicine

## 2014-05-20 ENCOUNTER — Ambulatory Visit: Payer: BC Managed Care – PPO | Admitting: Family Medicine

## 2014-06-15 ENCOUNTER — Ambulatory Visit (INDEPENDENT_AMBULATORY_CARE_PROVIDER_SITE_OTHER): Payer: BC Managed Care – PPO | Admitting: Sports Medicine

## 2014-06-15 ENCOUNTER — Encounter: Payer: Self-pay | Admitting: Sports Medicine

## 2014-06-15 VITALS — BP 135/92 | HR 118 | Temp 97.8°F | Ht 66.0 in | Wt 202.0 lb

## 2014-06-15 DIAGNOSIS — J029 Acute pharyngitis, unspecified: Secondary | ICD-10-CM

## 2014-06-15 DIAGNOSIS — O9279 Other disorders of lactation: Secondary | ICD-10-CM | POA: Diagnosis not present

## 2014-06-15 LAB — POCT RAPID STREP A (OFFICE): Rapid Strep A Screen: NEGATIVE

## 2014-06-15 MED ORDER — MELOXICAM 15 MG PO TABS: ORAL_TABLET | ORAL | Status: DC

## 2014-06-15 MED ORDER — HYDROCOD POLST-CHLORPHEN POLST 10-8 MG/5ML PO LQCR: 5.0000 mL | Freq: Two times a day (BID) | ORAL | Status: DC | PRN

## 2014-06-15 MED ORDER — AMOXICILLIN-POT CLAVULANATE 875-125 MG PO TABS: 1.0000 | ORAL_TABLET | Freq: Two times a day (BID) | ORAL | Status: DC

## 2014-06-15 MED ORDER — METOCLOPRAMIDE HCL 5 MG PO TABS: 5.0000 mg | ORAL_TABLET | Freq: Three times a day (TID) | ORAL | Status: DC

## 2014-06-15 NOTE — Progress Notes (Signed)
  Subjective:    CC: sore throat  HPI: This is a pleasant 28 year old female with sore throat and cough, she is currently breast-feeding. breastmilk supply is inadequate. Symptoms are moderate, persistent, no constitutional symptoms.  Past medical history, Surgical history, Family history not pertinant except as noted below, Social history, Allergies, and medications have been entered into the medical record, reviewed, and no changes needed.   Review of Systems: No fevers, chills, night sweats, weight loss, chest pain, or shortness of breath.   Objective:    General: Well Developed, well nourished, and in no acute distress.  Neuro: Alert and oriented x3, extra-ocular muscles intact, sensation grossly intact.  HEENT: Normocephalic, atraumatic, pupils equal round reactive to light, neck supple, no masses, no lymphadenopathy, thyroid nonpalpable. Nasopharynx, external ear canals are unremarkable, there is some swelling of both tonsils, and rightward deviation of the uvula. Skin: Warm and dry, no rashes. Cardiac: Regular rate and rhythm, no murmurs rubs or gallops, no lower extremity edema.  Respiratory: Clear to auscultation bilaterally. Not using accessory muscles, speaking in full sentences.  Impression and Recommendations:

## 2014-06-15 NOTE — Assessment & Plan Note (Signed)
With uvular deviation and severe sore throat we are going to add Augmentin. Tussionex for cough, meloxicam for pain.

## 2014-06-15 NOTE — Assessment & Plan Note (Signed)
Starting Reglan. She will follow this up with her PCP or her OB/GYN.

## 2014-11-29 ENCOUNTER — Other Ambulatory Visit: Payer: Self-pay | Admitting: Family Medicine

## 2014-12-01 ENCOUNTER — Other Ambulatory Visit: Payer: Self-pay | Admitting: *Deleted

## 2014-12-01 ENCOUNTER — Telehealth: Payer: Self-pay | Admitting: *Deleted

## 2014-12-01 ENCOUNTER — Other Ambulatory Visit: Payer: Self-pay | Admitting: Family Medicine

## 2014-12-01 MED ORDER — DROSPIRENONE-ETHINYL ESTRADIOL 3-0.02 MG PO TABS: 1.0000 | ORAL_TABLET | Freq: Every day | ORAL | Status: DC

## 2014-12-01 NOTE — Telephone Encounter (Signed)
Pt called in today asking for a refill on her clonazepam.  I explained to her that she had not been seen since Oct of 2015 and that she would need an appt.  She was a little difficult in the beginning, asking if she should "find someone else" that would be willing to rx without her coming in q583months.  I advised her that it was a personal decision that she would have to make.  I explained to her the procedure for benzos/controlled substances.  She explained that she really only needs her medicine for sleep at night so I explained to her that there may be something else that can be prescribed for her for sleep other than a benzo, and in that case, an appt would also be required. She voiced understanding and her tension seemed to ease a little more.  She is on your schedule for June 9.  This is just an [place].

## 2014-12-16 ENCOUNTER — Ambulatory Visit: Payer: Self-pay | Admitting: Family Medicine

## 2014-12-28 ENCOUNTER — Ambulatory Visit (INDEPENDENT_AMBULATORY_CARE_PROVIDER_SITE_OTHER): Payer: BLUE CROSS/BLUE SHIELD | Admitting: Family Medicine

## 2014-12-28 ENCOUNTER — Encounter: Payer: Self-pay | Admitting: Family Medicine

## 2014-12-28 VITALS — BP 128/79 | HR 99 | Wt 194.0 lb

## 2014-12-28 DIAGNOSIS — G43809 Other migraine, not intractable, without status migrainosus: Secondary | ICD-10-CM | POA: Diagnosis not present

## 2014-12-28 DIAGNOSIS — R946 Abnormal results of thyroid function studies: Secondary | ICD-10-CM

## 2014-12-28 DIAGNOSIS — F411 Generalized anxiety disorder: Secondary | ICD-10-CM

## 2014-12-28 DIAGNOSIS — R7989 Other specified abnormal findings of blood chemistry: Secondary | ICD-10-CM

## 2014-12-28 MED ORDER — CLONAZEPAM 0.5 MG PO TABS: 0.5000 mg | ORAL_TABLET | Freq: Two times a day (BID) | ORAL | Status: DC | PRN

## 2014-12-28 MED ORDER — SUMATRIPTAN SUCCINATE 100 MG PO TABS: 100.0000 mg | ORAL_TABLET | ORAL | Status: DC | PRN

## 2014-12-28 NOTE — Progress Notes (Signed)
   Subjective:    Patient ID: Tara Mcclain, female    DOB: June 26, 1986, 29 y.o.   MRN: [id number]  HPI here today for follow-up anxiety. The last time I saw her was 8 months ago. We have started her on sertraline but she never followed up and she is no longer taking that. She complains of feeling nervous and on edge several days a week and feeling like she worries too much about things several days a week. She says she has trouble relaxing more than half the days and becomes easily annoyed and irritable half the days. She does feel like things in a little bit better. 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 She said she's beginning a little bit more calm with job. She still worries a lot about her on who is young. She feels like this is one of the biggest stressors in her life. Not that he is difficult but just that she worries about things happening to him.  Migraine HA. Need refil lon her imitrex. Only having maybe one migraine per month. Medication works well. Does take it with naproxen.   She also had abnormal thyroid levels indicating possible hyper thyroidism 8 months ago. Had recommended referral to endocrinology but she never heard from the office. The referral was placed according to our records.   Review of Systems     Objective:   Physical Exam  Constitutional: She is oriented to person, place, and time. She appears well-developed and well-nourished.  HENT:  Head: Normocephalic and atraumatic.  Neck: Neck supple. No thyromegaly present.  Cardiovascular: Normal rate, regular rhythm and normal heart sounds.   Pulmonary/Chest: Effort normal and breath sounds normal.  Lymphadenopathy:    She has no cervical adenopathy.  Neurological: She is alert and oriented to person, place, and time.  Skin: Skin is warm and dry.  Psychiatric: She has a normal mood and affect. Her behavior is normal.          Assessment & Plan:  Anxiety - under fair control.  We'll continue current regimen. Did discuss possible referral to behavioral health or considering putting her on an SSRI if she feels like there is an increase in her symptoms are more persistent, or if she feels like she is using her rescue medication more frequently. Clonazepam refill today.  Migarine HA - well controlled without prophylaxis. Refilled Imitrex.    Abnormal thyroid - will start with just rechecking her blood work today. If still abnormal then will refer to endocrinology. Not sure what happened to the original referral as it was placed but she never heard from their office.

## 2014-12-29 LAB — T4, FREE: FREE T4: 1.11 ng/dL (ref 0.80–1.80)

## 2014-12-29 LAB — THYROID ANTIBODIES
THYROID PEROXIDASE ANTIBODY: 12 [IU]/mL — AB (ref <9)
Thyroglobulin Ab: 1 IU/mL (ref <2)

## 2014-12-29 LAB — T3, FREE: T3 FREE: 2.8 pg/mL (ref 2.3–4.2)

## 2014-12-29 LAB — TSH: TSH: 1.852 u[IU]/mL (ref 0.350–4.500)

## 2015-02-21 ENCOUNTER — Ambulatory Visit (INDEPENDENT_AMBULATORY_CARE_PROVIDER_SITE_OTHER): Payer: BLUE CROSS/BLUE SHIELD

## 2015-02-21 DIAGNOSIS — R112 Nausea with vomiting, unspecified: Secondary | ICD-10-CM | POA: Diagnosis not present

## 2015-02-21 DIAGNOSIS — R1011 Right upper quadrant pain: Secondary | ICD-10-CM

## 2015-02-23 ENCOUNTER — Other Ambulatory Visit: Payer: Self-pay | Admitting: Family Medicine

## 2015-02-23 ENCOUNTER — Other Ambulatory Visit: Payer: BLUE CROSS/BLUE SHIELD

## 2015-02-23 DIAGNOSIS — R112 Nausea with vomiting, unspecified: Secondary | ICD-10-CM

## 2015-02-23 DIAGNOSIS — R1011 Right upper quadrant pain: Secondary | ICD-10-CM

## 2015-03-02 ENCOUNTER — Telehealth: Payer: Self-pay | Admitting: Family Medicine

## 2015-03-02 NOTE — Telephone Encounter (Signed)
Informed Pt of normal results, questioned what the next step would be.  Pt is still having stomach pain all day, does come and go in severity. Still having diarrhea and vomiting frequently over the past three weeks. Pt questions if there are no other test to be done, should a referral for a specialist is an option at this time? Will route to PCP for review.

## 2015-03-02 NOTE — Telephone Encounter (Signed)
Her HIDA scan is normal.

## 2015-03-03 NOTE — Telephone Encounter (Signed)
We can refer her to GI. I haven't even ever seen her in the office for the condition. Ok to place referral if she is ok with that.

## 2015-03-03 NOTE — Telephone Encounter (Signed)
Called to ask Pt if she would like a referral placed to GI. Pt states she had to go to the ED last night and they placed a referral for her to GI. Advised Pt is there was anything else we could do for her to let us know.

## 2015-03-21 ENCOUNTER — Encounter: Payer: Self-pay | Admitting: Family Medicine

## 2015-04-07 ENCOUNTER — Encounter: Payer: Self-pay | Admitting: Family Medicine

## 2015-05-19 ENCOUNTER — Encounter: Payer: Self-pay | Admitting: Family Medicine

## 2015-06-05 ENCOUNTER — Emergency Department (INDEPENDENT_AMBULATORY_CARE_PROVIDER_SITE_OTHER)
Admission: EM | Admit: 2015-06-05 | Discharge: 2015-06-05 | Disposition: A | Discharge status: Home / Self Care | Payer: BLUE CROSS/BLUE SHIELD | Source: Home / Self Care | Attending: Family Medicine | Admitting: Family Medicine

## 2015-06-05 ENCOUNTER — Encounter: Payer: Self-pay | Admitting: Emergency Medicine

## 2015-06-05 DIAGNOSIS — R05 Cough: Secondary | ICD-10-CM

## 2015-06-05 DIAGNOSIS — R053 Chronic cough: Secondary | ICD-10-CM

## 2015-06-05 DIAGNOSIS — H938X3 Other specified disorders of ear, bilateral: Secondary | ICD-10-CM | POA: Diagnosis not present

## 2015-06-05 MED ORDER — BENZONATATE 200 MG PO CAPS: 200.0000 mg | ORAL_CAPSULE | Freq: Every day | ORAL | Status: DC

## 2015-06-05 MED ORDER — AZITHROMYCIN 250 MG PO TABS: ORAL_TABLET | ORAL | Status: DC

## 2015-06-05 NOTE — Discharge Instructions (Signed)
Take plain guaifenesin (1200mg  extended release tabs such as Mucinex) twice daily, with plenty of water, for cough and congestion.  May add Pseudoephedrine (30mg , one or two every 4 to 6 hours) for sinus congestion.  Get adequate rest.   May use Afrin nasal spray (or generic oxymetazoline) twice daily for about 5 days and then discontinue.  Also recommend using saline nasal spray several times daily and saline nasal irrigation (AYR is a common brand).   Try warm salt water gargles for sore throat.  Stop all antihistamines for now, and other non-prescription cough/cold preparations. May take Tylenol as needed for headache, etc.   Follow-up with family doctor if not improving about 7 to10 days.

## 2015-06-05 NOTE — ED Notes (Signed)
Pt c/o congestion, non productive cough, unable to sleep, right ear pain x 1 week.

## 2015-06-05 NOTE — ED Provider Notes (Signed)
CSN: [id number]     Arrival date & time 06/05/15  1104 History   First MD Initiated Contact with Patient 06/05/15 1137     Chief Complaint  Patient presents with  . Cough      HPI Comments: About one week ago patient developed sinus congestion, followed by a non-productive cough five days ago.  At that time her right ear felt clogged, and the sensation has persisted.  She often coughs until she gags.  Her last Tdap was in 2007.  The history is provided by the patient.    Past Medical History  Diagnosis Date  . GOA (generalized osteoarthritis)   . Mononucleosis 04-2005    history of  . Kidney stone on right side 04/2013   Past Surgical History  Procedure Laterality Date  . No past surgeries     Family History  Problem Relation Age of Onset  . Hypertension Father   . Hyperlipidemia Father   . Heart disease Father   . Diabetes Father    Social History  Substance Use Topics  . Smoking status: Never Smoker   . Smokeless tobacco: None  . Alcohol Use: 0.5 oz/week    1 drink(s) per week     Comment: occasional   OB History    Gravida Para Term Preterm AB TAB SAB Ectopic Multiple Living   1              Review of Systems + sore throat + cough No pleuritic pain No wheezing + nasal congestion + post-nasal drainage No sinus pain/pressure No itchy/red eyes No earache No hemoptysis + SOB No fever, + chills No nausea No vomiting No abdominal pain No diarrhea No urinary symptoms No skin rash + fatigue No myalgias No headache Used OTC meds without relief  Allergies  Review of patient's allergies indicates no known allergies.  Home Medications   Prior to Admission medications   Medication Sig Start Date End Date Taking? Authorizing Provider  omeprazole (PRILOSEC) 40 MG capsule Take 40 mg by mouth daily.   Yes Historical Provider, MD  azithromycin (ZITHROMAX Z-PAK) 250 MG tablet Take 2 tabs today; then begin one tab once daily for 4 more days. 06/05/15    Lattie Haw, MD  benzonatate (TESSALON) 200 MG capsule Take 1 capsule (200 mg total) by mouth at bedtime. Take as needed for cough 06/05/15   Lattie Haw, MD  drospirenone-ethinyl estradiol (GIANVI) 3-0.02 MG tablet Take 1 tablet by mouth daily. 12/01/14   Agapito Games, MD   Meds Ordered and Administered this Visit  Medications - No data to display  BP 144/84 mmHg  Pulse 91  Temp(Src) 98.3 F (36.8 C) (Oral)  Ht  (1.676 m)  Wt 204 lb 4 oz (92.647 kg)  BMI 32.98 kg/m2  SpO2 98%  LMP 04/09/2015 No data found.   Physical Exam Nursing notes and Vital Signs reviewed. Appearance:  Patient appears stated age, and in no acute distress Eyes:  Pupils are equal, round, and reactive to light and accomodation.  Extraocular movement is intact.  Conjunctivae are not inflamed  Ears:  Canals normal.  Tympanic membranes normal.  Nose:  Mildly congested turbinates.  No sinus tenderness.    Pharynx:  Normal Neck:  Supple.  Tender enlarged posterior nodes are palpated bilaterally  Lungs:  Clear to auscultation.  Breath sounds are equal.  Moving air well. Chest:  Tenderness to palpation over the mid-sternum.  Heart:  Regular rate and rhythm without  murmurs, rubs, or gallops.  Abdomen:  Nontender without masses or hepatosplenomegaly.  Bowel sounds are present.  No CVA or flank tenderness.  Extremities:  No edema.  No calf tenderness Skin:  No rash present.   ED Course  Procedures none    Labs Reviewed -   Tympanogram:  Normal both ears    MDM   1. Persistent cough; ?pertussis    Begin Z-pak for atypical coverage.  Prescription written for Benzonatate [facility](Tessalon) to take at bedtime for night-time cough.  Take plain guaifenesin (1200mg  extended release tabs such as Mucinex) twice daily, with plenty of water, for cough and congestion.  May add Pseudoephedrine (30mg , one or two every 4 to 6 hours) for sinus congestion.  Get adequate rest.   May use Afrin nasal spray (or generic  oxymetazoline) twice daily for about 5 days and then discontinue.  Also recommend using saline nasal spray several times daily and saline nasal irrigation (AYR is a common brand).   Try warm salt water gargles for sore throat.  Stop all antihistamines for now, and other non-prescription cough/cold preparations. May take Tylenol as needed for headache, etc.   Follow-up with family doctor if not improving about 7 to10 days.     Lattie HawStephen A Evie Croston, MD 06/05/15 [phone]

## 2015-06-06 ENCOUNTER — Telehealth: Payer: Self-pay

## 2015-06-06 MED ORDER — HYDROCODONE-HOMATROPINE 5-1.5 MG/5ML PO SYRP: 5.0000 mL | ORAL_SOLUTION | Freq: Every evening | ORAL | Status: DC | PRN

## 2015-06-06 NOTE — Telephone Encounter (Signed)
Patient advised.

## 2015-06-06 NOTE — Telephone Encounter (Signed)
Patient was seen at the urgent care for URI. She was given cough pills and reports the medication is not helping and would like a cough syrup. Please advise.

## 2015-06-06 NOTE — Telephone Encounter (Signed)
Prescription printed. She will have to pick up hard copy.

## 2015-09-04 ENCOUNTER — Emergency Department (INDEPENDENT_AMBULATORY_CARE_PROVIDER_SITE_OTHER)
Admission: EM | Admit: 2015-09-04 | Discharge: 2015-09-04 | Disposition: A | Discharge status: Home / Self Care | Payer: 59 | Source: Home / Self Care | Attending: Family Medicine | Admitting: Family Medicine

## 2015-09-04 ENCOUNTER — Other Ambulatory Visit: Payer: Self-pay | Admitting: Family Medicine

## 2015-09-04 DIAGNOSIS — J019 Acute sinusitis, unspecified: Secondary | ICD-10-CM | POA: Diagnosis not present

## 2015-09-04 DIAGNOSIS — J069 Acute upper respiratory infection, unspecified: Secondary | ICD-10-CM | POA: Diagnosis not present

## 2015-09-04 LAB — POCT INFLUENZA A/B
Influenza A, POC: NEGATIVE
Influenza B, POC: NEGATIVE

## 2015-09-04 MED ORDER — FLUTICASONE PROPIONATE 50 MCG/ACT NA SUSP: 2.0000 | Freq: Every day | NASAL | Status: DC

## 2015-09-04 MED ORDER — AMOXICILLIN-POT CLAVULANATE 875-125 MG PO TABS: 1.0000 | ORAL_TABLET | Freq: Two times a day (BID) | ORAL | Status: DC

## 2015-09-04 MED ORDER — BENZONATATE 100 MG PO CAPS: 100.0000 mg | ORAL_CAPSULE | Freq: Three times a day (TID) | ORAL | Status: DC

## 2015-09-04 NOTE — ED Notes (Signed)
For past week, patient has had sinus pressure and pain, as well as coughing at night, ears popping, but denies fever.  Child was dx with RSV last week.

## 2015-09-04 NOTE — ED Provider Notes (Signed)
CSN: [id number]     Arrival date & time 09/04/15  1105 History   First MD Initiated Contact with Patient 09/04/15 1219     Chief Complaint  Patient presents with  . Facial Pain   (Consider location/radiation/quality/duration/timing/severity/associated sxs/prior Treatment) HPI The pt is a 30yo female presenting to [facility] with c/o sinus congestion with pain and pressure along with moderately intermittent productive cough that is worse at night and bilateral ear popping.  She states her child had RSV last week and is concerned she has it now. Denies fever or chills. No hx of asthma.   She has had mild nausea and diarrhea but notes she had her gallbladder removed about 6 weeks ago and thinks the GI symptoms are due to the recent surgery.  She has been taking Mucinex and OTC sinus medication but no relief of congestion.   Past Medical History  Diagnosis Date  . GOA (generalized osteoarthritis)   . Mononucleosis 04-2005    history of  . Kidney stone on right side 04/2013   Past Surgical History  Procedure Laterality Date  . No past surgeries    . Cholecystectomy    . Nephrostomy     Family History  Problem Relation Age of Onset  . Hypertension Father   . Hyperlipidemia Father   . Heart disease Father   . Diabetes Father    Social History  Substance Use Topics  . Smoking status: Never Smoker   . Smokeless tobacco: None  . Alcohol Use: 0.5 oz/week    1 Standard drinks or equivalent per week     Comment: occasional   OB History    Gravida Para Term Preterm AB TAB SAB Ectopic Multiple Living   1              Review of Systems  Constitutional: Positive for fatigue. Negative for fever and chills.  HENT: Positive for congestion, ear pain ( bilateral), sore throat and voice change. Negative for trouble swallowing.   Respiratory: Positive for cough. Negative for shortness of breath.   Cardiovascular: Negative for chest pain and palpitations.  Gastrointestinal: Positive for nausea and  diarrhea. Negative for vomiting and abdominal pain.  Musculoskeletal: Negative for myalgias, back pain and arthralgias.  Skin: Negative for rash.  Neurological: Positive for headaches. Negative for dizziness and light-headedness.    Allergies  Review of patient's allergies indicates no known allergies.  Home Medications   Prior to Admission medications   Medication Sig Start Date End Date Taking? Authorizing Provider  amoxicillin-clavulanate (AUGMENTIN) 875-125 MG tablet Take 1 tablet by mouth 2 (two) times daily. One po bid x 7 days 09/04/15   Junius Finner, PA-C  azithromycin (ZITHROMAX Z-PAK) 250 MG tablet Take 2 tabs today; then begin one tab once daily for 4 more days. 06/05/15   Lattie Haw, MD  benzonatate (TESSALON) 100 MG capsule Take 1-2 capsules (100-200 mg total) by mouth every 8 (eight) hours. 09/04/15   Junius Finner, PA-C  benzonatate (TESSALON) 200 MG capsule Take 1 capsule (200 mg total) by mouth at bedtime. Take as needed for cough 06/05/15   Lattie Haw, MD  drospirenone-ethinyl estradiol (GIANVI) 3-0.02 MG tablet Take 1 tablet by mouth daily. 12/01/14   Agapito Games, MD  fluticasone (FLONASE) 50 MCG/ACT nasal spray Place 2 sprays into both nostrils daily. 09/04/15   Junius Finner, PA-C  HYDROcodone-homatropine (HYCODAN) 5-1.5 MG/5ML syrup Take 5 mLs by mouth at bedtime as needed for cough. 06/06/15   Santina Evans  Randell Patient, MD  omeprazole (PRILOSEC) 40 MG capsule Take 40 mg by mouth daily.    Historical Provider, MD   Meds Ordered and Administered this Visit  Medications - No data to display  BP 145/85 mmHg  Pulse 98  Temp(Src) 98.3 F (36.8 C) (Oral)  Ht  (1.676 m)  Wt 198 lb 12.8 oz (90.175 kg)  BMI 32.10 kg/m2  SpO2 98%  LMP 08/14/2015 No data found.   Physical Exam  Constitutional: She appears well-developed and well-nourished. No distress.  HENT:  Head: Normocephalic and atraumatic.  Right Ear: A middle ear effusion is present.  Left  Ear: Tympanic membrane normal.  Nose: Mucosal edema and rhinorrhea present. Right sinus exhibits maxillary sinus tenderness. Left sinus exhibits maxillary sinus tenderness.  Mouth/Throat: Uvula is midline and mucous membranes are normal. Posterior oropharyngeal erythema present. No oropharyngeal exudate, posterior oropharyngeal edema or tonsillar abscesses.  Eyes: Conjunctivae are normal. No scleral icterus.  Neck: Normal range of motion. Neck supple.  Cardiovascular: Normal rate, regular rhythm and normal heart sounds.   Pulmonary/Chest: Effort normal and breath sounds normal. No stridor. No respiratory distress. She has no wheezes. She has no rales. She exhibits no tenderness.  Abdominal: Soft. She exhibits no distension. There is no tenderness.  Musculoskeletal: Normal range of motion.  Neurological: She is alert.  Skin: Skin is warm and dry. She is not diaphoretic.  Nursing note and vitals reviewed.   ED Course  Procedures (including critical care time)  Labs Review Labs Reviewed  POCT INFLUENZA A/B    Imaging Review No results found.    MDM   1. Acute rhinosinusitis   2. Acute upper respiratory infection    Pt c/o 1 week of worsening sinus congestion with facial pain. Exam c/w sinusitis  Rx: augmentin, flonase and tessalon  Advised pt to use acetaminophen and ibuprofen as needed for fever and pain. Encouraged rest and fluids. F/u with PCP in 7-10 days if not improving, sooner if worsening. Pt verbalized understanding and agreement with tx plan.     Junius Finner, PA-C 09/04/15 1453

## 2015-09-04 NOTE — Discharge Instructions (Signed)
You may take 400-600mg Ibuprofen (Motrin) every 6-8 hours for fever and pain  °Alternate with Tylenol  °You may take 500mg Tylenol every 4-6 hours as needed for fever and pain  °Follow-up with your primary care provider next week for recheck of symptoms if not improving.  °Be sure to drink plenty of fluids and rest, at least 8hrs of sleep a night, preferably more while you are sick. °Return urgent care or go to closest ER if you cannot keep down fluids/signs of dehydration, fever not reducing with Tylenol, difficulty breathing/wheezing, stiff neck, worsening condition, or other concerns (see below)  °Please take antibiotics as prescribed and be sure to complete entire course even if you start to feel better to ensure infection does not come back. ° ° °Cool Mist Vaporizers °Vaporizers may help relieve the symptoms of a cough and cold. They add moisture to the air, which helps mucus to become thinner and less sticky. This makes it easier to breathe and cough up secretions. Cool mist vaporizers do not cause serious burns like hot mist vaporizers, which may also be called steamers or humidifiers. Vaporizers have not been proven to help with colds. You should not use a vaporizer if you are allergic to mold. °HOME CARE INSTRUCTIONS °· Follow the package instructions for the vaporizer. °· Do not use anything other than distilled water in the vaporizer. °· Do not run the vaporizer all of the time. This can cause mold or bacteria to grow in the vaporizer. °· Clean the vaporizer after each time it is used. °· Clean and dry the vaporizer well before storing it. °· Stop using the vaporizer if worsening respiratory symptoms develop. °  °This information is not intended to replace advice given to you by your health care provider. Make sure you discuss any questions you have with your health care provider. °  °Document Released: 03/22/2004 Document Revised: 06/30/2013 Document Reviewed: 11/12/2012 °Elsevier Interactive Patient  Education ©2016 [employer] Inc[employer] ° °Sinus Rinse °WHAT IS A SINUS RINSE? °A sinus rinse is a simple home treatment that is used to rinse your sinuses with a sterile mixture of salt and water (saline solution). Sinuses are air-filled spaces in your skull behind the bones of your face and forehead that open into your nasal cavity. °You will use the following: °· Saline solution. °· Neti pot or spray bottle. This releases the saline solution into your nose and through your sinuses. Neti pots and spray bottles can be purchased at your local pharmacy, a health food store, or online. °WHEN WOULD I DO A SINUS RINSE? °A sinus rinse can help to clear mucus, dirt, dust, or pollen from the nasal cavity. You may do a sinus rinse when you have a cold, a virus, nasal allergy symptoms, a sinus infection, or stuffiness in the nose or sinuses. °If you are considering a sinus rinse: °· Ask your child's health care provider before performing a sinus rinse on your child. °· Do not do a sinus rinse if you have had ear or nasal surgery, ear infection, or blocked ears. °HOW DO I DO A SINUS RINSE? °· Wash your hands. °· Disinfect your device according to the directions provided and then dry it. °· Use the solution that comes with your device or one that is sold separately in stores. Follow the mixing directions on the package. °· Fill your device with the amount of saline solution as directed by the device instructions. °· Stand over a sink and tilt your head sideways over   the sink. °· Place the spout of the device in your upper nostril (the one closer to the ceiling). °· Gently pour or squeeze the saline solution into the nasal cavity. The liquid should drain to the lower nostril if you are not overly congested. °· Gently blow your nose. Blowing too hard may cause ear pain. °· Repeat in the other nostril. °· Clean and rinse your device with clean water and then air-dry it. °ARE THERE RISKS OF A SINUS RINSE?  °Sinus rinse is generally very  safe and effective. However, there are a few risks, which include:  °· A burning sensation in the sinuses. This may happen if you do not make the saline solution as directed. Make sure to follow all directions when making the saline solution. °· Infection from contaminated water. This is rare, but possible. °· Nasal irritation. °  °This information is not intended to replace advice given to you by your health care provider. Make sure you discuss any questions you have with your health care provider. °  °Document Released: 01/20/2014 Document Reviewed: 01/20/2014 °Elsevier Interactive Patient Education ©2016 Elsevier Inc. ° °

## 2015-09-05 NOTE — Telephone Encounter (Signed)
DUE for pap

## 2015-09-18 ENCOUNTER — Encounter: Payer: Self-pay | Admitting: Emergency Medicine

## 2015-09-18 ENCOUNTER — Emergency Department (INDEPENDENT_AMBULATORY_CARE_PROVIDER_SITE_OTHER)
Admission: EM | Admit: 2015-09-18 | Discharge: 2015-09-18 | Disposition: A | Discharge status: Home / Self Care | Payer: 59 | Source: Home / Self Care | Attending: Family Medicine | Admitting: Family Medicine

## 2015-09-18 DIAGNOSIS — R6889 Other general symptoms and signs: Secondary | ICD-10-CM | POA: Diagnosis not present

## 2015-09-18 LAB — POCT INFLUENZA A/B
Influenza A, POC: NEGATIVE
Influenza B, POC: NEGATIVE

## 2015-09-18 MED ORDER — OSELTAMIVIR PHOSPHATE 75 MG PO CAPS: 75.0000 mg | ORAL_CAPSULE | Freq: Two times a day (BID) | ORAL | Status: DC

## 2015-09-18 NOTE — Discharge Instructions (Signed)

## 2015-09-18 NOTE — ED Notes (Signed)
Patient presents with flu like symptoms since last pm

## 2015-09-18 NOTE — ED Provider Notes (Signed)
CSN: [id number]     Arrival date & time 09/18/15  1143 History   First MD Initiated Contact with Patient 09/18/15 1235     Chief Complaint  Patient presents with  . Nasal Congestion  . Generalized Body Aches  . Cough  . Fever   (Consider location/radiation/quality/duration/timing/severity/associated sxs/prior Treatment) HPI  The pt is a 30yo female presenting to [facility] with c/o flu-like symptoms that started suddenly yesterday afternoon.  Pt reports fever Tmax 102.4*F, resolves with acetaminophen.  Body aches and fatigue most bothersome. Denies sick contacts or recent travel. Denies n/v/d. She did not receive flu vaccine this year.   Past Medical History  Diagnosis Date  . GOA (generalized osteoarthritis)   . Mononucleosis 04-2005    history of  . Kidney stone on right side 04/2013   Past Surgical History  Procedure Laterality Date  . No past surgeries    . Cholecystectomy    . Nephrostomy     Family History  Problem Relation Age of Onset  . Hypertension Father   . Hyperlipidemia Father   . Heart disease Father   . Diabetes Father    Social History  Substance Use Topics  . Smoking status: Never Smoker   . Smokeless tobacco: None  . Alcohol Use: 0.5 oz/week    1 Standard drinks or equivalent per week     Comment: occasional   OB History    Gravida Para Term Preterm AB TAB SAB Ectopic Multiple Living   1              Review of Systems  Constitutional: Positive for fever, chills and fatigue.  HENT: Positive for congestion. Negative for ear pain, sore throat, trouble swallowing and voice change.   Respiratory: Positive for cough. Negative for shortness of breath.   Cardiovascular: Negative for chest pain and palpitations.  Gastrointestinal: Negative for nausea, vomiting, abdominal pain and diarrhea.  Musculoskeletal: Positive for myalgias and arthralgias. Negative for back pain.  Skin: Negative for rash.  Neurological: Positive for headaches. Negative for dizziness and  light-headedness.    Allergies  Review of patient's allergies indicates no known allergies.  Home Medications   Prior to Admission medications   Medication Sig Start Date End Date Taking? Authorizing Provider  amoxicillin-clavulanate (AUGMENTIN) 875-125 MG tablet Take 1 tablet by mouth 2 (two) times daily. One po bid x 7 days 09/04/15   Junius Finner, PA-C  azithromycin (ZITHROMAX Z-PAK) 250 MG tablet Take 2 tabs today; then begin one tab once daily for 4 more days. 06/05/15   Lattie Haw, MD  benzonatate (TESSALON) 100 MG capsule Take 1-2 capsules (100-200 mg total) by mouth every 8 (eight) hours. 09/04/15   Junius Finner, PA-C  benzonatate (TESSALON) 200 MG capsule Take 1 capsule (200 mg total) by mouth at bedtime. Take as needed for cough 06/05/15   Lattie Haw, MD  fluticasone [facility]) 50 MCG/ACT nasal spray Place 2 sprays into both nostrils daily. 09/04/15   Junius Finner, PA-C  HYDROcodone-homatropine (HYCODAN) 5-1.5 MG/5ML syrup Take 5 mLs by mouth at bedtime as needed for cough. 06/06/15   Agapito Games, MD  LORYNA 3-0.02 MG tablet TAKE 1 TABLET BY MOUTH DAILY. 09/05/15   Agapito Games, MD  omeprazole (PRILOSEC) 40 MG capsule Take 40 mg by mouth daily.    Historical Provider, MD  oseltamivir (TAMIFLU) 75 MG capsule Take 1 capsule (75 mg total) by mouth every 12 (twelve) hours. 09/18/15   Junius Finner, PA-C  Meds Ordered and Administered this Visit  Medications - No data to display  BP 124/82 mmHg  Pulse 100  Temp(Src) 98.2 F (36.8 C) (Oral)  Resp 16  Ht 5\' 6"  (1.676 m)  Wt 199 lb 4 oz (90.379 kg)  BMI 32.18 kg/m2  SpO2 99%  LMP 09/18/2015 No data found.   Physical Exam  Constitutional: She appears well-developed and well-nourished. No distress.  HENT:  Head: Normocephalic and atraumatic.  Right Ear: Tympanic membrane normal.  Left Ear: Tympanic membrane normal.  Nose: Rhinorrhea present.  Mouth/Throat: Uvula is midline, oropharynx is clear and  moist and mucous membranes are normal.  Eyes: Conjunctivae are normal. No scleral icterus.  Neck: Normal range of motion. Neck supple.  Cardiovascular: Normal rate, regular rhythm and normal heart sounds.   Pulmonary/Chest: Effort normal and breath sounds normal. No respiratory distress. She has no wheezes. She has no rales.  Abdominal: Soft. She exhibits no distension. There is no tenderness.  Musculoskeletal: Normal range of motion.  Neurological: She is alert.  Skin: Skin is warm and dry. She is not diaphoretic.  Nursing note and vitals reviewed.   ED Course  Procedures (including critical care time)  Labs Review Labs Reviewed  POCT INFLUENZA A/B    Imaging Review No results found.   MDM   1. Flu-like symptoms    Pt c/o sudden onset flu-like symptoms. Rapid flu: negative  Discussed potential of false negatives. Pt would like to try Tamiflu Rx: tamiflu  Advised pt to use acetaminophen and ibuprofen as needed for fever and pain. Encouraged rest and fluids. F/u with PCP in 1 week if not improving, sooner if worsening. Pt verbalized understanding and agreement with tx plan.     Junius Finnerrin O'Malley, PA-C 09/18/15 1259

## 2015-09-21 ENCOUNTER — Encounter: Payer: Self-pay | Admitting: Family Medicine

## 2015-09-21 ENCOUNTER — Ambulatory Visit (INDEPENDENT_AMBULATORY_CARE_PROVIDER_SITE_OTHER): Payer: 59 | Admitting: Family Medicine

## 2015-09-21 VITALS — BP 122/83 | HR 111 | Temp 98.0°F | Wt 201.0 lb

## 2015-09-21 DIAGNOSIS — J019 Acute sinusitis, unspecified: Secondary | ICD-10-CM | POA: Diagnosis not present

## 2015-09-21 DIAGNOSIS — J218 Acute bronchiolitis due to other specified organisms: Secondary | ICD-10-CM | POA: Diagnosis not present

## 2015-09-21 MED ORDER — AZITHROMYCIN 250 MG PO TABS: ORAL_TABLET | ORAL | Status: AC

## 2015-09-21 NOTE — Progress Notes (Signed)
No show

## 2015-09-21 NOTE — Addendum Note (Signed)
Addended by: Nani GasserMETHENEY, Grayer Sproles D on: 09/21/2015 12:22 PM   Modules accepted: Orders, Level of Service

## 2015-09-21 NOTE — Progress Notes (Signed)
   Subjective:    Patient ID: Tara Mcclain, female    DOB: 07-21-1985, 30 y.o.   MRN: [id number]  HPI Patient is here today because she is not feeling any better. She started feeling sick about 5 days ago. She was having sore throat nasal congestion with runny nose myalgias and fever up to 102 so she was seen in urgent care 4 days ago. She tested negative for flu but they felt like her symptoms were consistent sedated decided to start her on Tamiflu. She has been taking the medication regularly and has 1 more dose this evening and 1 in the morning and then she will complete the regimen. She started coughing 2 days ago. She denies any wheezing or shortness of breath. She still having some low-grade temperatures around the 100 and still just does not feel well. She complains of feeling extremely fatigued and having enormous amounts of thick nasal discharge and congestion. She's had some facial pain and pressure.   Review of Systems     Objective:   Physical Exam  Constitutional: She is oriented to person, place, and time. She appears well-developed and well-nourished.  HENT:  Head: Normocephalic and atraumatic.  Right Ear: External ear normal.  Left Ear: External ear normal.  Nose: Nose normal.  Mouth/Throat: Oropharynx is clear and moist.  TMs and canals are clear.   Eyes: Conjunctivae and EOM are normal. Pupils are equal, round, and reactive to light.  Neck: Neck supple. No thyromegaly present.  Cardiovascular: Normal rate, regular rhythm and normal heart sounds.   Pulmonary/Chest: Effort normal and breath sounds normal. She has no wheezes.  Lymphadenopathy:    She has no cervical adenopathy.  Neurological: She is alert and oriented to person, place, and time.  Skin: Skin is warm and dry.  Psychiatric: She has a normal mood and affect.          Assessment & Plan:   Acute sinusitis/bronchitis-she is only been sick for 5 days of this may still just be a more severe viral  illness but since she is not feeling any better on the Tamiflu and if anything seems to be getting worse then recommend that we go ahead and treat her with azithromycin. Call back if not better or suddenly worsens over the next week. Okay to continue symptomatic care. The Tamiflu as well.   Note, patient was initially marked as a no-show but had actually not been arrived in the system even though she had been sitting in the waiting room.

## 2015-09-21 NOTE — Addendum Note (Signed)
Addended by: Deno EtienneBARKLEY, Sohum Delillo L on: 09/21/2015 12:30 PM   Modules accepted: Orders, Medications

## 2016-01-02 ENCOUNTER — Telehealth: Payer: Self-pay | Admitting: Family Medicine

## 2016-01-02 DIAGNOSIS — G43809 Other migraine, not intractable, without status migrainosus: Secondary | ICD-10-CM

## 2016-01-02 NOTE — Telephone Encounter (Signed)
Pt called. She would like to be referred to a specialist because she's getting  Migraines monthly instead of every  3 months as it had been doing in the pass. Thanks.

## 2016-01-04 NOTE — Telephone Encounter (Signed)
Referral placed.Tara Mcclain  

## 2016-01-30 ENCOUNTER — Encounter: Payer: 59 | Admitting: Family Medicine

## 2016-02-24 ENCOUNTER — Other Ambulatory Visit: Payer: Self-pay

## 2016-02-28 ENCOUNTER — Encounter: Payer: 59 | Admitting: Family Medicine

## 2016-03-06 ENCOUNTER — Encounter: Payer: Self-pay | Admitting: Family Medicine

## 2016-03-06 ENCOUNTER — Ambulatory Visit (INDEPENDENT_AMBULATORY_CARE_PROVIDER_SITE_OTHER): Payer: 59 | Admitting: Family Medicine

## 2016-03-06 ENCOUNTER — Other Ambulatory Visit (HOSPITAL_COMMUNITY)
Admission: RE | Admit: 2016-03-06 | Discharge: 2016-03-06 | Disposition: A | Discharge status: Home / Self Care | Payer: 59 | Source: Ambulatory Visit | Attending: Family Medicine | Admitting: Family Medicine

## 2016-03-06 VITALS — BP 119/75 | HR 101 | Ht 65.5 in | Wt 176.0 lb

## 2016-03-06 DIAGNOSIS — F329 Major depressive disorder, single episode, unspecified: Secondary | ICD-10-CM | POA: Diagnosis not present

## 2016-03-06 DIAGNOSIS — Z01419 Encounter for gynecological examination (general) (routine) without abnormal findings: Secondary | ICD-10-CM

## 2016-03-06 DIAGNOSIS — F32A Depression, unspecified: Secondary | ICD-10-CM | POA: Insufficient documentation

## 2016-03-06 DIAGNOSIS — Z8349 Family history of other endocrine, nutritional and metabolic diseases: Secondary | ICD-10-CM

## 2016-03-06 DIAGNOSIS — Z23 Encounter for immunization: Secondary | ICD-10-CM | POA: Diagnosis not present

## 2016-03-06 DIAGNOSIS — Z1151 Encounter for screening for human papillomavirus (HPV): Secondary | ICD-10-CM | POA: Diagnosis not present

## 2016-03-06 DIAGNOSIS — E282 Polycystic ovarian syndrome: Secondary | ICD-10-CM | POA: Insufficient documentation

## 2016-03-06 MED ORDER — FLUOXETINE HCL 10 MG PO CAPS: 10.0000 mg | ORAL_CAPSULE | Freq: Every day | ORAL | 0 refills | Status: DC

## 2016-03-06 MED ORDER — CLONAZEPAM 0.5 MG PO TABS: 0.5000 mg | ORAL_TABLET | Freq: Two times a day (BID) | ORAL | 0 refills | Status: DC | PRN

## 2016-03-06 NOTE — Addendum Note (Signed)
Addended by: Nani GasserMETHENEY, Quentina Fronek D on: 03/06/2016 11:13 AM   Modules accepted: Orders

## 2016-03-06 NOTE — Patient Instructions (Signed)
Keep up a regular exercise program and make sure you are eating a healthy diet Try to eat 4 servings of dairy a day, or if you are lactose intolerant take a calcium with vitamin D daily.  Your vaccines are up to date.   

## 2016-03-06 NOTE — Progress Notes (Addendum)
Subjective:     Tara Mcclain is a 30 y.o. female and is here for a comprehensive physical exam. The patient reports no problems.  She has been under a lot of stress recently. She recently had a falling out with her sister who has bipolar disorder. She's actually been pretty upset about it because she has been able to see her 37-year-old niece who she is very very close to. She has felt somewhat down and sad about it. She also recently was started on Topamax to her neurologist to help control her migraines. Unfortunately she felt like it was causing some shift in her mood as well as some invasive thoughts. She says she's even had thoughts of pulling in front of a truck while driving and this has really bothered her. She actually called her neurologist office yesterday and asked to be tapered off of the Topamax. She said she's never had thoughts like this before and really thinks it's from the medication she is also asking for refill on her clonazepam today which she says she uses sparingly.    Social History   Social History  . Marital status: Single    Spouse name: Josh  . Number of children: N/A  . Years of education: N/A   Occupational History  . 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 Smoking status: Never Smoker  . Smokeless tobacco: Not on file  . Alcohol use 0.5 oz/week    1 Standard drinks or equivalent per week     Comment: occasional  . Drug use: Unknown  . Sexual activity: Yes    Partners: Male   Other Topics Concern  . Not on file   Social History Narrative   Daily caffeine.  Some regular exercise.    Health Maintenance  Topic Date Due  . PAP SMEAR  07/30/2015  . INFLUENZA VACCINE  02/07/2016  . TETANUS/TDAP  08/12/2023  . HIV Screening  Completed    The following portions of the patient's history were reviewed and updated as appropriate: allergies, current medications, past family history, past medical history, past social history, past  surgical history and problem list.  Review of Systems A comprehensive review of systems was negative.   Objective:    BP 119/75   Pulse (!) 101   Ht 5' 5.5" (1.664 m)   Wt 176 lb (79.8 kg)   LMP 02/21/2016   SpO2 100%   BMI 28.84 kg/m  General appearance: alert, cooperative and appears stated age Head: Normocephalic, without obvious abnormality, atraumatic Eyes: conj clear, EOMI, PEERLA Ears: normal TM's and external ear canals both ears Nose: Nares normal. Septum midline. Mucosa normal. No drainage or sinus tenderness. Throat: lips, mucosa, and tongue normal; teeth and gums normal Neck: no adenopathy, no carotid bruit, no JVD, supple, symmetrical, trachea midline and thyroid not enlarged, symmetric, no tenderness/mass/nodules Back: symmetric, no curvature. ROM normal. No CVA tenderness. Lungs: clear to auscultation bilaterally Breasts: normal appearance, no masses or tenderness Heart: regular rate and rhythm, S1, S2 normal, no murmur, click, rub or gallop Abdomen: soft, non-tender; bowel sounds normal; no masses,  no organomegaly Pelvic: cervix normal in appearance, external genitalia normal, no adnexal masses or tenderness, no cervical motion tenderness, rectovaginal septum normal, uterus normal size, shape, and consistency and vagina normal without discharge Extremities: extremities normal, atraumatic, no cyanosis or edema Pulses: 2+ and symmetric Skin: Skin color, texture, turgor normal. No rashes or lesions Lymph nodes: Cervical, supraclavicular,  and axillary nodes normal. Neurologic: Alert and oriented X 3, normal strength and tone. Normal symmetric reflexes. Normal coordination and gait    Assessment:    Healthy female exam.     Plan:     See After Visit Summary for Counseling Recommendations    Keep up a regular exercise program and make sure you are eating a healthy diet Try to eat 4 servings of dairy a day, or if you are lactose intolerant take a calcium with  vitamin D daily.  Your vaccines are up to date.  Smear performed. Will call with results once available.  Depression-PHQ 9 score of 10 today. Discussed starting fluoxetine. New prescription sent to the pharmacy. Did go ahead and refill her clonazepam to use sparingly. Hopefully some of the invasive thoughts that she is currently having will resolve after she is off the Topamax. She says she is not actively suicidal and does feel safe to go home. F/U in 3 weeks.    Due for Tdap, thinks had it 2 years ago. Will contact [facility].    Flu vaccine given.   Will check TSH since sister recently dx with Hashimoto's

## 2016-03-07 LAB — COMPLETE METABOLIC PANEL WITH GFR
ALT: 16 U/L (ref 6–29)
AST: 14 U/L (ref 10–30)
Albumin: 4.5 g/dL (ref 3.6–5.1)
Alkaline Phosphatase: 58 U/L (ref 33–115)
BILIRUBIN TOTAL: 0.6 mg/dL (ref 0.2–1.2)
BUN: 15 mg/dL (ref 7–25)
CALCIUM: 9.2 mg/dL (ref 8.6–10.2)
CO2: 24 mmol/L (ref 20–31)
Chloride: 105 mmol/L (ref 98–110)
Creat: 0.92 mg/dL (ref 0.50–1.10)
GFR, EST NON AFRICAN AMERICAN: 83 mL/min (ref >=60)
Glucose, Bld: 111 mg/dL — ABNORMAL HIGH (ref 65–99)
Potassium: 4 mmol/L (ref 3.5–5.3)
Sodium: 137 mmol/L (ref 135–146)
TOTAL PROTEIN: 7.1 g/dL (ref 6.1–8.1)

## 2016-03-07 LAB — LIPID PANEL
CHOLESTEROL: 158 mg/dL (ref 125–200)
HDL: 54 mg/dL (ref >=46)
LDL Cholesterol: 83 mg/dL (ref <130)
TRIGLYCERIDES: 106 mg/dL (ref <150)
Total CHOL/HDL Ratio: 2.9 Ratio (ref <=5.0)
VLDL: 21 mg/dL (ref <30)

## 2016-03-07 LAB — CYTOLOGY - PAP

## 2016-03-07 LAB — TSH: TSH: 1.72 m[IU]/L

## 2016-03-08 NOTE — Progress Notes (Signed)
Call patient: Your Pap smear is normal. Repeat in 5 years.

## 2016-03-27 ENCOUNTER — Ambulatory Visit: Payer: 59 | Admitting: Family Medicine

## 2016-09-13 ENCOUNTER — Other Ambulatory Visit: Payer: Self-pay | Admitting: Family Medicine

## 2017-05-13 ENCOUNTER — Encounter: Payer: Self-pay | Admitting: Physician Assistant

## 2017-05-13 ENCOUNTER — Ambulatory Visit (INDEPENDENT_AMBULATORY_CARE_PROVIDER_SITE_OTHER): Payer: 59 | Admitting: Physician Assistant

## 2017-05-13 ENCOUNTER — Ambulatory Visit (INDEPENDENT_AMBULATORY_CARE_PROVIDER_SITE_OTHER): Payer: 59

## 2017-05-13 VITALS — BP 122/86 | HR 73 | Temp 98.1°F | Wt 224.0 lb

## 2017-05-13 DIAGNOSIS — R05 Cough: Secondary | ICD-10-CM

## 2017-05-13 DIAGNOSIS — R053 Chronic cough: Secondary | ICD-10-CM

## 2017-05-13 MED ORDER — GUAIFENESIN-CODEINE 100-10 MG/5ML PO SOLN: 5.0000 mL | Freq: Every evening | ORAL | 0 refills | Status: DC | PRN

## 2017-05-13 MED ORDER — AZITHROMYCIN 250 MG PO TABS: ORAL_TABLET | ORAL | 0 refills | Status: DC

## 2017-05-13 NOTE — Patient Instructions (Signed)
Cough, Adult Coughing is a reflex that clears your throat and your airways. Coughing helps to heal and protect your lungs. It is normal to cough occasionally, but a cough that happens with other symptoms or lasts a long time may be a sign of a condition that needs treatment. A cough may last only 2-3 weeks (acute), or it may last longer than 8 weeks (chronic). What are the causes? Coughing is commonly caused by:  Breathing in substances that irritate your lungs.  A viral or bacterial respiratory infection.  Allergies.  Asthma.  Postnasal drip.  Smoking.  Acid backing up from the stomach into the esophagus (gastroesophageal reflux).  Certain medicines.  Chronic lung problems, including COPD (or rarely, lung cancer).  Other medical conditions such as heart failure.  Follow these instructions at home: Pay attention to any changes in your symptoms. Take these actions to help with your discomfort:  Take medicines only as told by your health care provider. ? If you were prescribed an antibiotic medicine, take it as told by your health care provider. Do not stop taking the antibiotic even if you start to feel better. ? Talk with your health care provider before you take a cough suppressant medicine.  Drink enough fluid to keep your urine clear or pale yellow.  If the air is dry, use a cold steam vaporizer or humidifier in your bedroom or your home to help loosen secretions.  Avoid anything that causes you to cough at work or at home.  If your cough is worse at night, try sleeping in a semi-upright position.  Avoid cigarette smoke. If you smoke, quit smoking. If you need help quitting, ask your health care provider.  Avoid caffeine.  Avoid alcohol.  Rest as needed.  Contact a health care provider if:  You have new symptoms.  You cough up pus.  Your cough does not get better after 2-3 weeks, or your cough gets worse.  You cannot control your cough with suppressant  medicines and you are losing sleep.  You develop pain that is getting worse or pain that is not controlled with pain medicines.  You have a fever.  You have unexplained weight loss.  You have night sweats. Get help right away if:  You cough up blood.  You have difficulty breathing.  Your heartbeat is very fast. This information is not intended to replace advice given to you by your health care provider. Make sure you discuss any questions you have with your health care provider. Document Released: 12/22/2010 Document Revised: 12/01/2015 Document Reviewed: 09/01/2014 Elsevier Interactive Patient Education  2017 [employer] Inc.  

## 2017-05-13 NOTE — Progress Notes (Addendum)
HPI:                                                                Tara Mcclain is a 31 y.o. female who [facility] today for cough and congestion  Patient reports cough, worse at night and lying down x 2 months. Symptoms have been waxing and waning. Reports second sickening approximately 2 weeks ago. Reports cough is productive of mucus. Denies fever, chills, malaise, night sweats, hemoptysis, shortness of breath or wheezing. Denies history of smoking, asthma, pneumonia, or pulmonary disease. There is sinus pressure, congestion and postnasal drip. She has taken Augmentin prescribed by a telemedicine clinic and Nyquil without relief.  Past Medical History:  Diagnosis Date  . GOA (generalized osteoarthritis)   . Kidney stone on right side 04/2013  . Mononucleosis 04-2005   history of   Past Surgical History:  Procedure Laterality Date  . CHOLECYSTECTOMY    . NEPHROSTOMY     Social History   Tobacco Use  . Smoking status: Never Smoker  . Smokeless tobacco: Never Used  Substance Use Topics  . Alcohol use: Yes    Alcohol/week: 0.5 oz    Types: 1 Standard drinks or equivalent per week    Comment: occasional   family history includes Diabetes in her father; Hashimoto's thyroiditis in her sister; Heart disease in her father; Hyperlipidemia in her father; Hypertension in her father.  ROS: negative except as noted in the HPI  Medications: Current Outpatient Medications  Medication Sig Dispense Refill  . clonazePAM (KLONOPIN) 0.5 MG tablet Take 1 tablet (0.5 mg total) by mouth 2 (two) times daily as needed for anxiety. Patient needs to schedule a follow up appointment with PCP before more refills. 10 tablet 0  . folic acid (FOLVITE) 1 MG tablet Take 1 mg by mouth daily.  6  . GLYCOPYRROLATE PO Take 2 (two) times daily by mouth.    . Hyoscyamine Sulfate (HYOSCYAMINE PO) Take as needed by mouth.    . ketoprofen  (ORUDIS) 75 MG capsule TK 1 C PO Q 8 H PRN  6  . Pancreatin (PAN-2400 PO) Take by mouth.    . propranolol ER (INDERAL LA) 60 MG 24 hr capsule TAKE ONE TABLET BY MOUTH DAILY FOR 1 WEEK THEN INCREASE TO 2 DAILY: FOR MIGRAINES  2  . RELPAX 40 MG tablet TK 1 T PO AT ONSET OF HEADACHE. MAY REPEAT IN 2 HOURS.  MAX 2 TS IN 24 HOURS  6  . azithromycin (ZITHROMAX Z-PAK) 250 MG tablet Take 2 tablets (500 mg) on  Day 1,  followed by 1 tablet (250 mg) once daily on Days 2 through 5. 6 tablet 0  . guaiFENesin-codeine 100-10 MG/5ML syrup Take 5 mLs at bedtime as needed by mouth for cough. 180 mL 0   No current facility-administered medications for this visit.    No Known Allergies     Objective:  BP 122/86   Pulse 73   Temp 98.1 F (36.7 C)   Wt 224 lb (101.6 kg)   LMP 04/29/2017 (Approximate)   SpO2 97%   Breastfeeding? No   BMI 36.71 kg/m  Gen:  alert, not ill-appearing, no distress, appropriate for age HEENT: head normocephalic  without obvious abnormality, conjunctiva and cornea clear, TM's clear bilaterally, nasal mucosa edematous, no frontal or maxillary sinus tenderness, neck supple, no adenopathy, trachea midline Pulm: Normal work of breathing, normal phonation, clear to auscultation bilaterally, no wheezes, rales or rhonchi CV: Normal rate, regular rhythm, s1 and s2 distinct, no murmurs, clicks or rubs  Neuro: alert and oriented x 3, no tremor MSK: extremities atraumatic, normal gait and station Skin: intact, no rashes on exposed skin, no cyanosis   No flowsheet data found.   No results found for this or any previous visit (from the past 72 hour(s)). No results found.    Assessment and Plan: 31 y.o. female with   1. Persistent cough for 3 weeks or longer - vitals reviewed and normal, SpO2 97% on RA at rest - DG Chest 2 View to assess for infiltrate - will treat empirically for CAP/pertussis with Azithromycin given second sickening and persistent symptoms for 8 weeks. We  discussed that there is likely a component of this that is post-nasal drip and need to treat sinus symptoms - continue symptomatic management with netty pot, oral decongestant and cough suppressant - guaiFENesin-codeine 100-10 MG/5ML syrup; Take 5 mLs at bedtime as needed by mouth for cough.  Dispense: 180 mL; Refill: 0 - azithromycin (ZITHROMAX Z-PAK) 250 MG tablet; Take 2 tablets (500 mg) on  Day 1,  followed by 1 tablet (250 mg) once daily on Days 2 through 5.  Dispense: 6 tablet; Refill: 0   Patient education and anticipatory guidance given Patient agrees with treatment plan Follow-up with PCP in 1 week as needed if symptoms worsen or fail to improve  Levonne Hubert PA-C

## 2017-05-14 NOTE — Progress Notes (Signed)
Normal chest x-ray  No evidence of pneumonia

## 2017-05-20 ENCOUNTER — Encounter: Payer: Self-pay | Admitting: Family Medicine

## 2017-05-20 ENCOUNTER — Ambulatory Visit (INDEPENDENT_AMBULATORY_CARE_PROVIDER_SITE_OTHER): Payer: 59 | Admitting: Family Medicine

## 2017-05-20 VITALS — BP 98/62 | HR 59 | Temp 97.8°F | Wt 224.0 lb

## 2017-05-20 DIAGNOSIS — R05 Cough: Secondary | ICD-10-CM

## 2017-05-20 DIAGNOSIS — R053 Chronic cough: Secondary | ICD-10-CM

## 2017-05-20 NOTE — Progress Notes (Signed)
   Subjective:    Patient ID: Tara Mcclain, female    DOB: [birth year], 31 y.o.   MRN: [id number]  HPI 31-year-old comes in today to follow-up on cough.  She was seen 1 week ago exactly.  At that point in time she complained of a cough that have been going on for almost 2 months that was worse when she would lie down at night.  Symptoms have been waxing and waning which is why she waited to come in for the appointment.  At that point in time they decided to treat with azithromycin.  She was also expressing some sinus congestion type symptoms.  She was also given a cough syrup and recommended to use a Nettie pot and oral decongestant.  Chest x-ray was performed November 5 and was normal.   She feels 50%.  She says the sputum is no longer green it is more clear but she still having a little bit of congestion mostly in the throat area.  No shortness of breath.  No fevers chills or sweats.  Review of Systems     Objective:   Physical Exam  Constitutional: She is oriented to person, place, and time. She appears well-developed and well-nourished.  HENT:  Head: Normocephalic and atraumatic.  Right Ear: External ear normal.  Left Ear: External ear normal.  Nose: Nose normal.  Mouth/Throat: Oropharynx is clear and moist.  TMs and canals are clear.   Eyes: Conjunctivae and EOM are normal. Pupils are equal, round, and reactive to light.  Neck: Neck supple. No thyromegaly present.  Cardiovascular: Normal rate, regular rhythm and normal heart sounds.  Pulmonary/Chest: Effort normal and breath sounds normal. She has no wheezes.  Lymphadenopathy:    She has no cervical adenopathy.  Neurological: She is alert and oriented to person, place, and time.  Skin: Skin is warm and dry.  Psychiatric: She has a normal mood and affect.        Assessment & Plan:  Chronic cough-at this think that time I think most of her cough is coming from postnasal drip and drainage.  She is 50% better.  We will have  her start doing nasal saline rinse and adding either Flonase or Nasonex for the next 2 weeks.  If she is not feeling much better after that then please let me know.  We could always get a sinus CT limited if needed.

## 2017-05-24 ENCOUNTER — Encounter: Payer: 59 | Admitting: Sports Medicine

## 2017-07-11 IMAGING — US US ABDOMEN COMPLETE
1 series · 13 of 25 positions shown · non-contrast
Comparison: None in PACs.

CLINICAL DATA: Right upper quadrant and epigastric discomfort
associated with nausea, vomiting, and diarrhea for the past 2 weeks,
symptoms worsened by fatty foods; history of kidney stones
warranting nephrostomy tubes in 5162

EXAM:
ULTRASOUND ABDOMEN COMPLETE

[Series 1: us abdomen complete · 0.17mm/px · 13 of 170 slices shown]
[im 1/170]
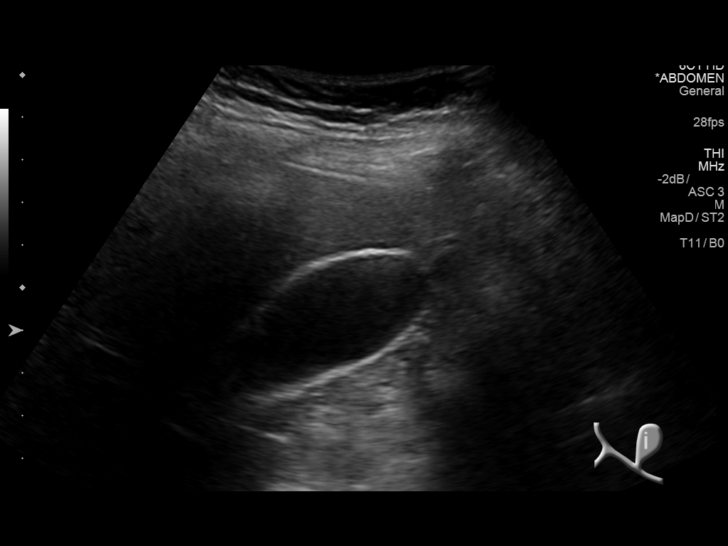
[im 15/170]
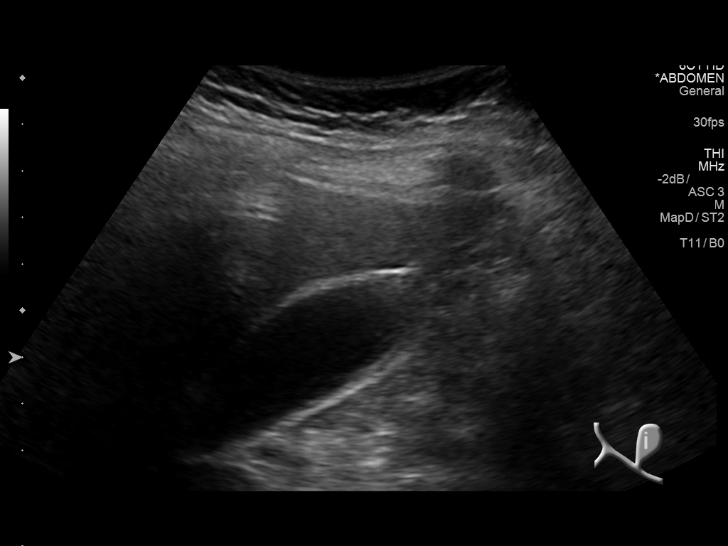
[im 29/170]
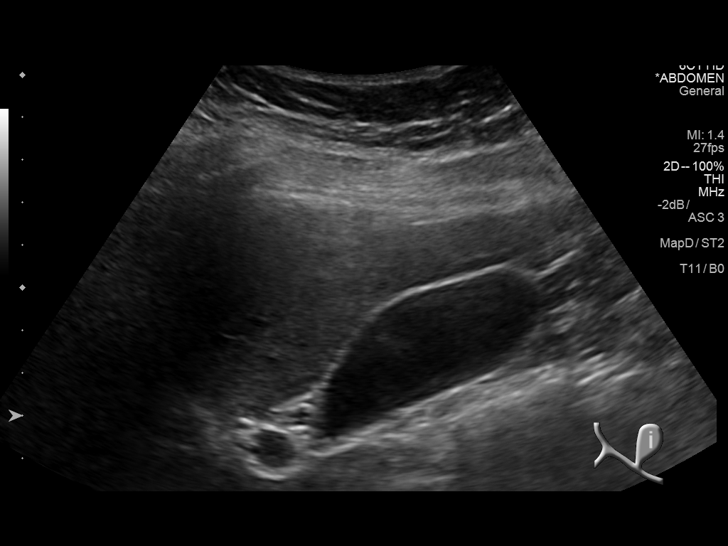
[im 43/170]
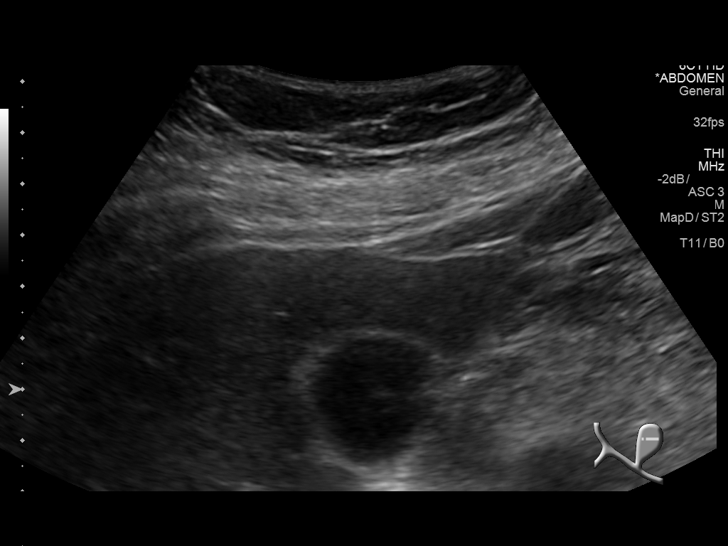
[im 57/170]
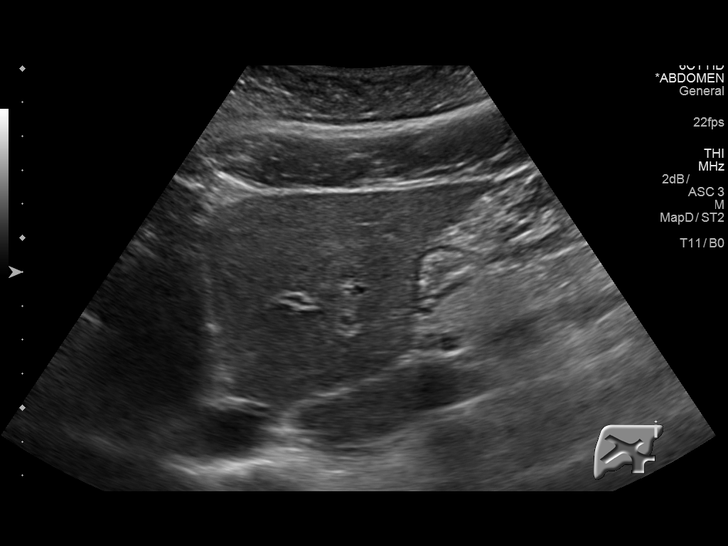
[im 71/170]
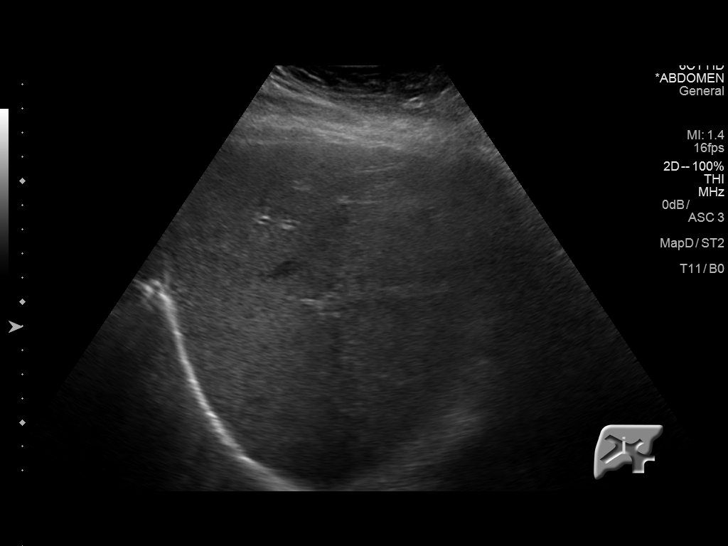
[im 85/170]
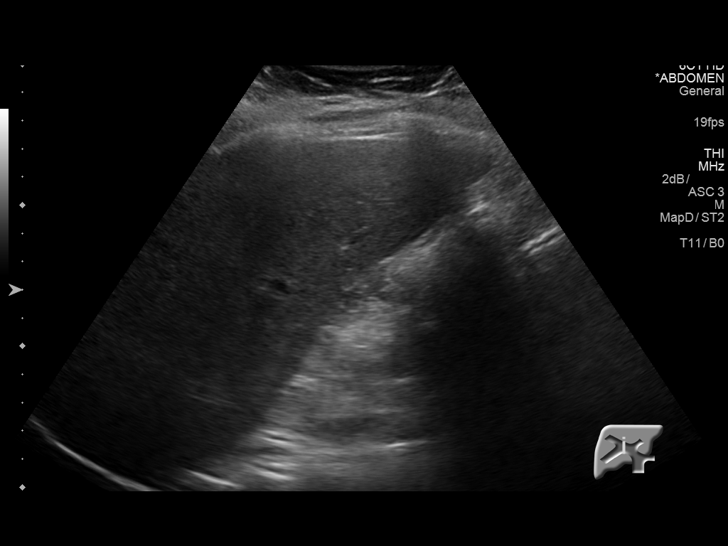
[im 99/170]
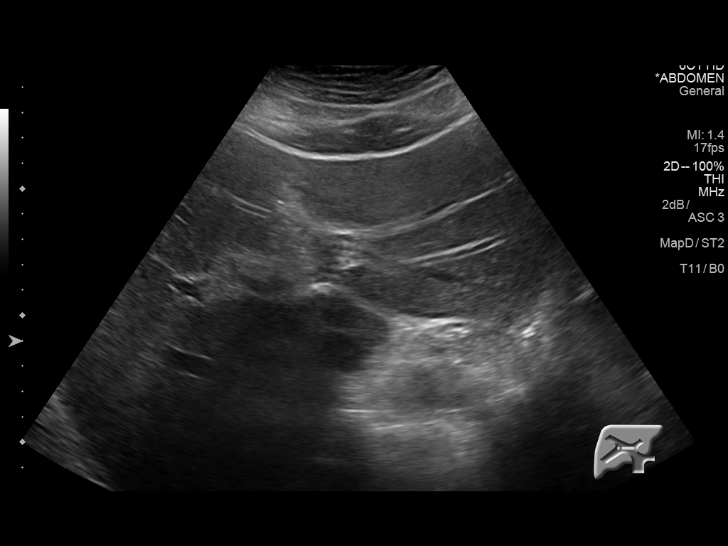
[im 113/170]
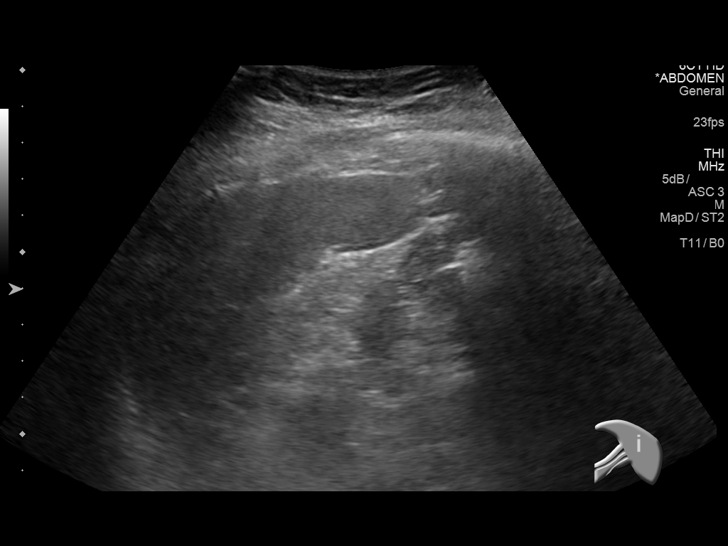
[im 127/170]
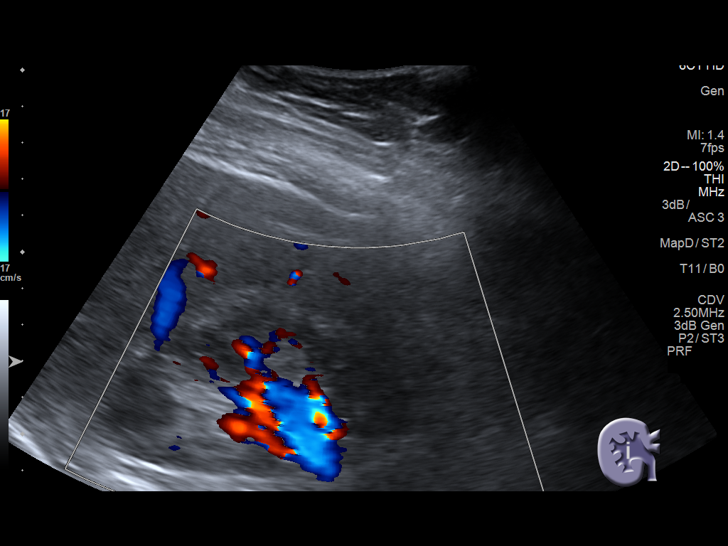
[im 141/170]
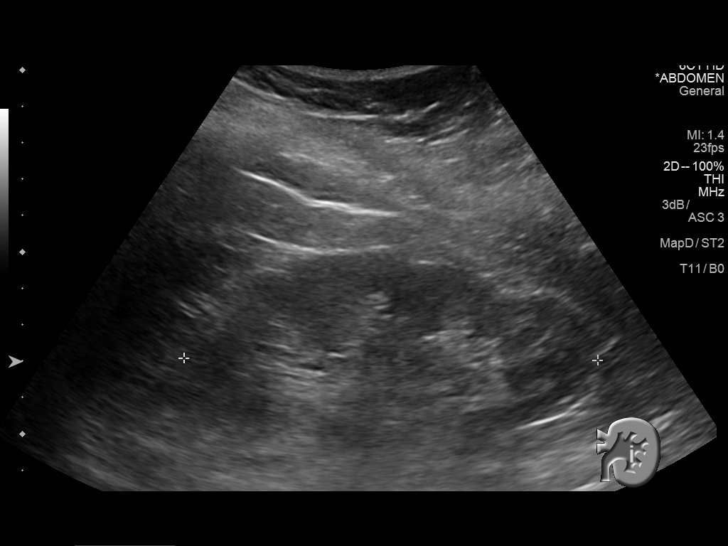
[im 155/170]
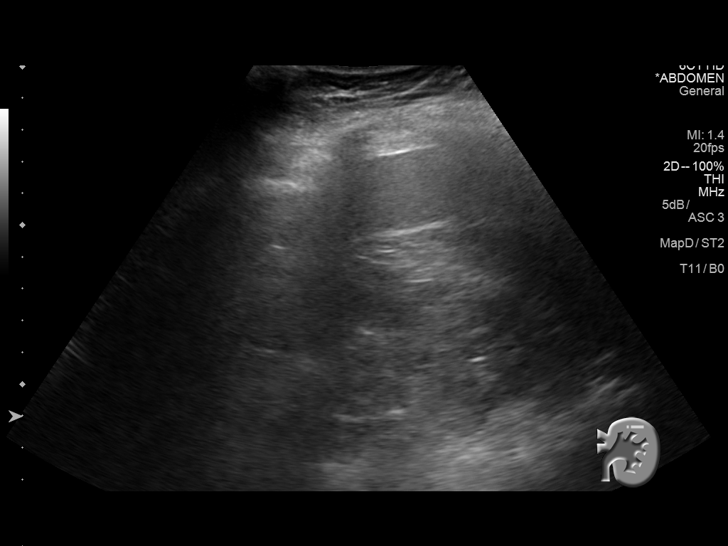
[im 170/170]
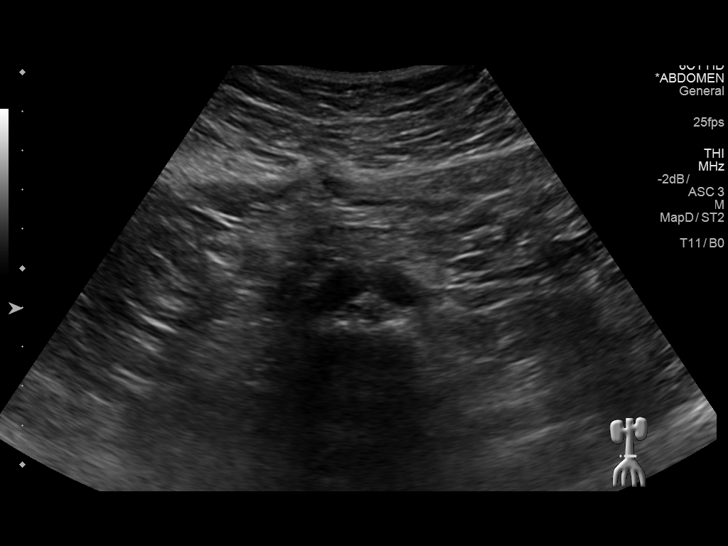

[13 of 25 positions shown; findings below may reference images not displayed]

The images or report of a previous renal
ultrasound December 30, 2014 performed at [[email or web address]] is not
available for review.
FINDINGS: Gallbladder: There is sludge versus radiodense bile present. No
gallstones or wall thickening visualized. No sonographic Murphy sign
noted.

Common bile duct: Diameter: 3.3 mm

Liver: In the right hepatic lobe there is an echogenic focus which
measures 1.9 x 1.8 x 2.3 cm. There are no hypoechoic masses. There
is no intrahepatic ductal dilation.

IVC: Limited by bowel gas.

Pancreas: Portions of the pancreatic head and tail were obscured by
bowel gas. The pancreatic body is normal.

Spleen: Size and appearance within normal limits.

Right Kidney: Length: 10.8 cm. Echogenicity within normal limits. No
mass or hydronephrosis visualized.

Left Kidney: Length: 11.4 cm. Echogenicity within normal limits. No
mass or hydronephrosis visualized. A previously described left renal
cysts disc is not visible.

Abdominal aorta: No aneurysm visualized.

Other findings: No ascites is observed.
IMPRESSION: 1. The study is limited due to bowel gas. There is sludge within the
gallbladder without evidence of acute cholecystitis. Further
evaluation with a nuclear medicine hepatobiliary scan may be useful.
2. There is a probable hemangioma in the right hepatic lobe.

## 2017-08-21 ENCOUNTER — Ambulatory Visit (INDEPENDENT_AMBULATORY_CARE_PROVIDER_SITE_OTHER): Payer: BLUE CROSS/BLUE SHIELD

## 2017-08-21 ENCOUNTER — Encounter: Payer: Self-pay | Admitting: Physician Assistant

## 2017-08-21 ENCOUNTER — Ambulatory Visit (INDEPENDENT_AMBULATORY_CARE_PROVIDER_SITE_OTHER): Payer: BLUE CROSS/BLUE SHIELD | Admitting: Physician Assistant

## 2017-08-21 ENCOUNTER — Other Ambulatory Visit: Payer: Self-pay | Admitting: Physician Assistant

## 2017-08-21 VITALS — BP 112/67 | HR 69 | Temp 98.3°F | Ht 65.5 in | Wt 216.0 lb

## 2017-08-21 DIAGNOSIS — J342 Deviated nasal septum: Secondary | ICD-10-CM | POA: Diagnosis not present

## 2017-08-21 DIAGNOSIS — J320 Chronic maxillary sinusitis: Secondary | ICD-10-CM | POA: Diagnosis not present

## 2017-08-21 MED ORDER — METHYLPREDNISOLONE 4 MG PO TBPK: ORAL_TABLET | ORAL | 0 refills | Status: AC

## 2017-08-21 NOTE — Progress Notes (Signed)
Sent!

## 2017-08-21 NOTE — Progress Notes (Signed)
Subjective:    Patient ID: Tara Mcclain, female    DOB: 01/17/1986, 32 y.o.   MRN: [id number]  HPI  Patient is a 32 year old female with persistent sinus pressure and sinus drainage since September of last year presents to the clinic today with right-sided sinus pressure, green productive sinus drainage and neck soreness.  Tara Mcclain has had 3 antibiotics since September.  The only time Tara Mcclain feels better is while Tara Mcclain is on the antibiotic and a few days later.  December Tara Mcclain felt the best that Tara Mcclain was on antibiotics most of the month.  Patient feels like 80% of her discomfort is on the right side.  Some days Tara Mcclain will feel like Tara Mcclain gets a little better and then it will hit her again.  Tara Mcclain tries Flonase off and on.  Tara Mcclain denies any sinus rinses or Nettie pot. Tara Mcclain ran a low grade fever for the last few days. Denies any allergies.   .. Active Ambulatory Problems    Diagnosis Date Noted  . 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 Kidney stones 04/21/2013  . Generalized anxiety disorder 12/28/2014  . Family history of thyroid disease in sister 03/06/2016  . PCOS (polycystic ovarian syndrome) 03/06/2016  . Acute depression 03/06/2016   Resolved Ambulatory Problems    Diagnosis Date Noted  . Acute pharyngitis 04/05/2009  . ESOPHAGEAL SPASM 01/15/2007  . Acute cystitis 04/14/2008  . Abdominal pain, generalized 09/01/2009  . Acute pharyngitis 06/15/2014  . Inadequate milk production 06/15/2014  . Needs flu shot 03/06/2016   Past Medical History:  Diagnosis Date  . GOA (generalized osteoarthritis)   . Kidney stone on right side 04/2013  . Mononucleosis 04-2005      Review of Systems     Objective:   Physical Exam  Constitutional: Tara Mcclain is oriented to person, place, and time. Tara Mcclain appears well-developed and well-nourished.  HENT:  Head: Normocephalic and atraumatic.  Right Ear: External ear normal.  Left Ear: External ear normal.  TM"s clear bilaterally.   Tenderness over right  maxillary sinus.  Swollen and red nasal turbinates with deviation of septum to the left.   Bilateral slightly hypertrophic tonsils without exudate. uvula midline.   Eyes: Conjunctivae are normal. Right eye exhibits no discharge. Left eye exhibits no discharge.  Neck: Normal range of motion. Neck supple.  Tenderness to palpation over anterior cervical neck but no lymphadenopathy palpated. There is a fullness bilaterally noted.   Cardiovascular: Normal rate, regular rhythm and normal heart sounds.  Pulmonary/Chest: Effort normal and breath sounds normal. Tara Mcclain has no wheezes.  Neurological: Tara Mcclain is alert and oriented to person, place, and time.  Psychiatric: Tara Mcclain has a normal mood and affect. Her behavior is normal.          Assessment & Plan:  Marland Kitchen.Marland Kitchen.Diagnoses and all orders for this visit:  Chronic right maxillary sinusitis -     CT MAXILLOFACIAL LTD WO CM   I am concerned with this potentially being the fourth antibiotics since September.  Likely this does represent a chronic sinusitis.  I would like to go ahead and get a CT of the sinuses today.  I am also going to go ahead and refer her to ear nose and throat for evaluation.  I do see a slight deviated septum on exam today and I suspect that Tara Mcclain could have some sinus polyps or other etiology that is causing these chronic sinusitis symptoms.  I have instructed her to start Flonase 1 spray each nostril twice a  day.  I will determine the need for antibiotic after I reviewed the CT results.  Pt denies any chance of being pregnant.

## 2017-08-21 NOTE — Progress Notes (Signed)
Normal CT. Referral to ENT and medrol dose pack.

## 2017-08-21 NOTE — Progress Notes (Signed)
Call pt: sinuses are completely clear! Which is great news. No infection. However you are still having sinus symptoms which is not good news. Discussed plan with PCP as well but we are going to get you in with ENT in the meantime use flonase every day, sinus rinses if can tolerate, we could also do a short term steroid taper to see if could help with some inflammation. Would you like that?

## 2017-08-21 NOTE — Patient Instructions (Signed)
Get CT will call with results.

## 2019-02-04 IMAGING — DX DG CHEST 2V
2 series · 2 of 2 positions shown · non-contrast
Comparison: None.

CLINICAL DATA: Cough x2 months

EXAM:
CHEST  2 VIEW

[chest pa]
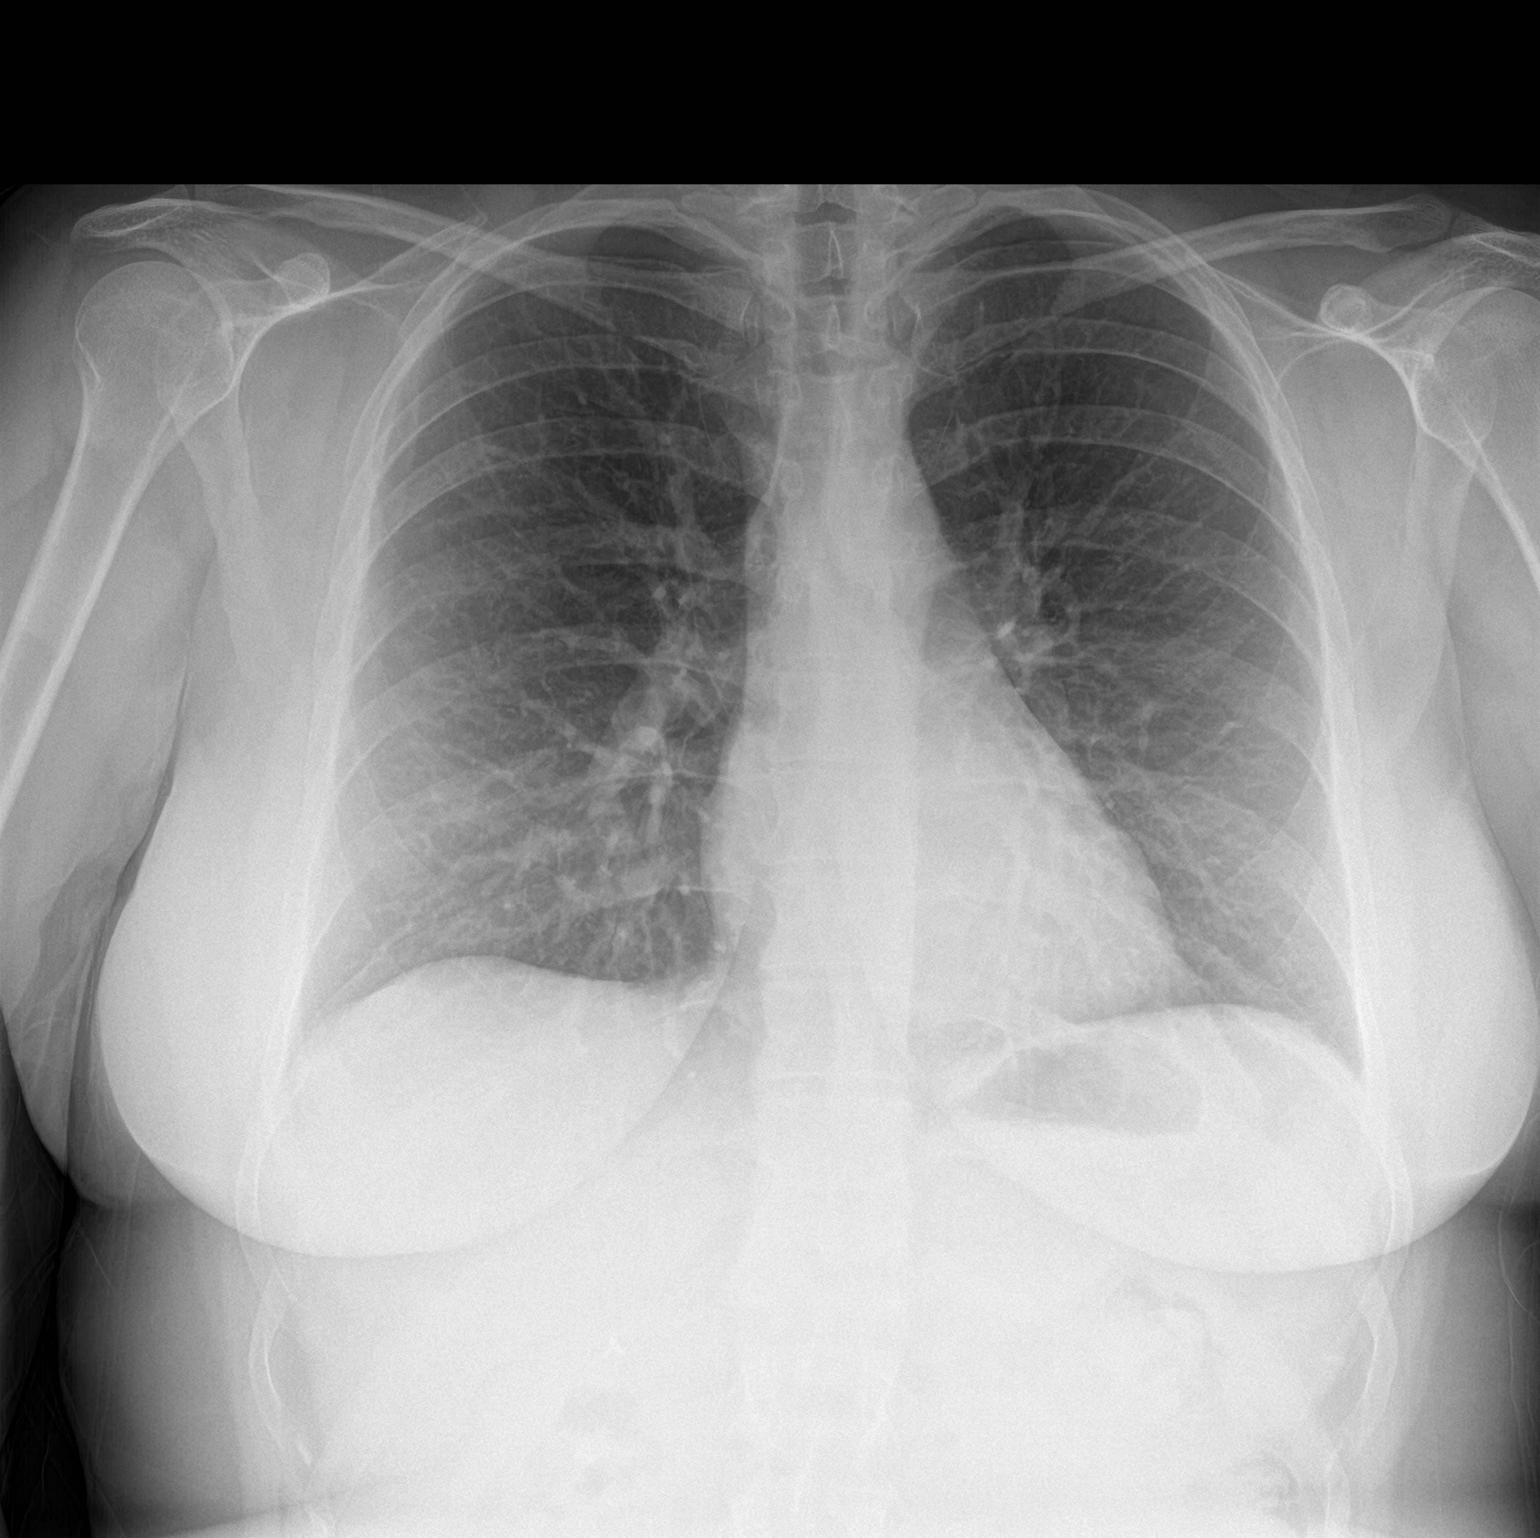

[chest lat]
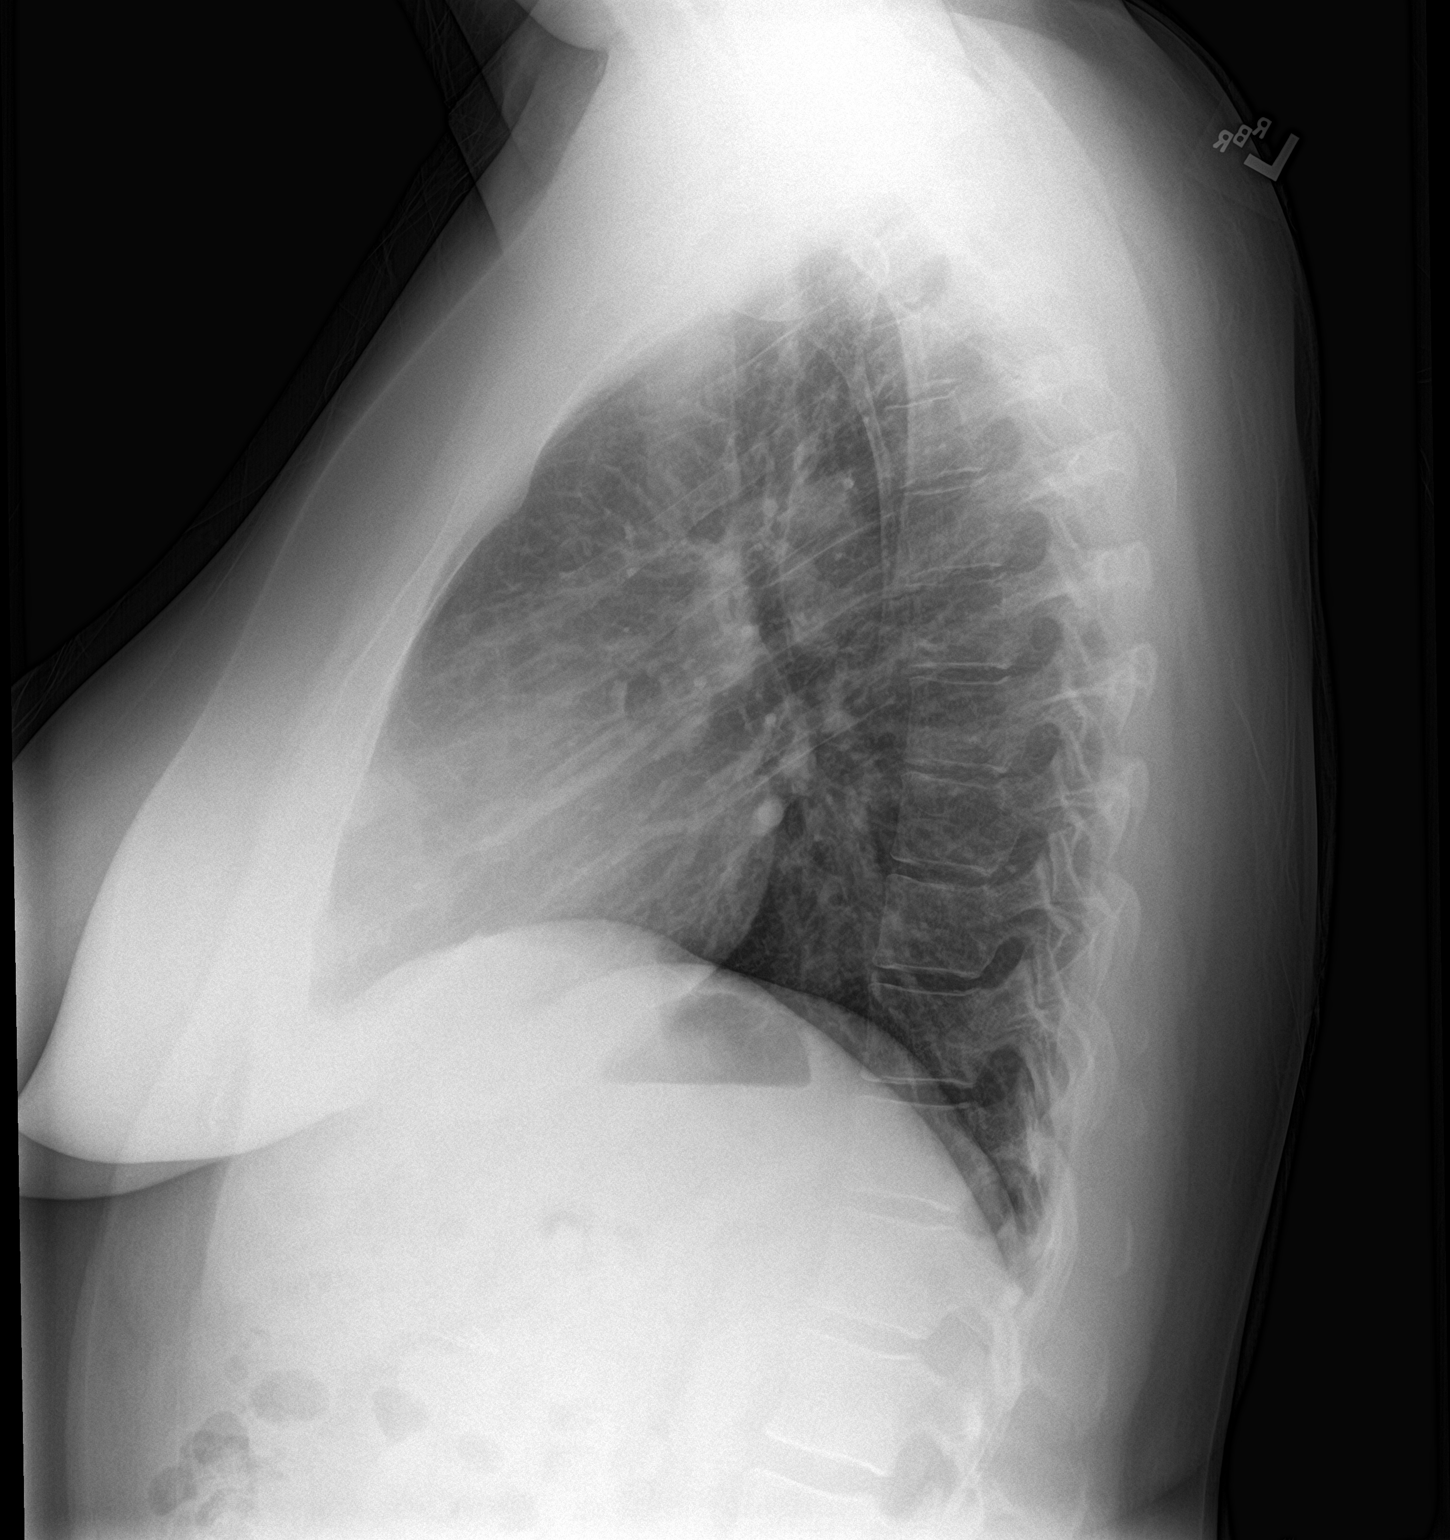

[2 of 2 positions shown; findings below may reference images not displayed]

FINDINGS: Lungs are clear.  No pleural effusion or pneumothorax.

The heart is normal in size.

Visualized osseous structures are within normal limits.
IMPRESSION: Normal chest radiographs.

## 2019-05-15 IMAGING — CT CT PARANASAL SINUSES LIMITED
1 series · 11 of 13 positions shown, 14 images · non-contrast
Comparison: None.

CLINICAL DATA: Chronic right maxillary sinusitis

EXAM:
CT PARANASAL SINUS LIMITED WITHOUT CONTRAST
TECHNIQUE: Non-contiguous multidetector CT images of the paranasal sinuses were
obtained in a single plane without contrast.

[Series 3: limited sinus st · axial · 0.30mm/px · z∈[-145,-45]mm · 11 of 13 slices shown, 14 images]
[im 2/13  brain]
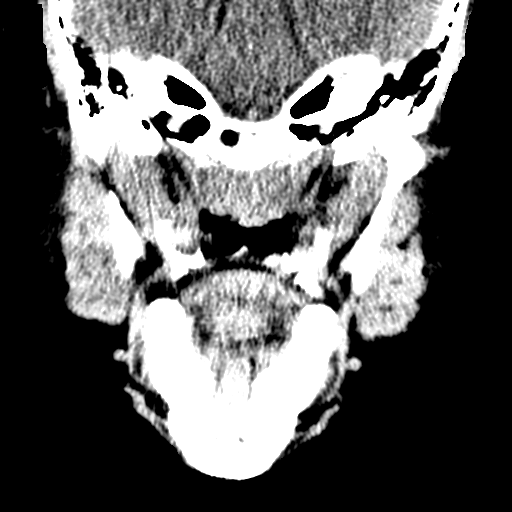
[im 2/13  bone]
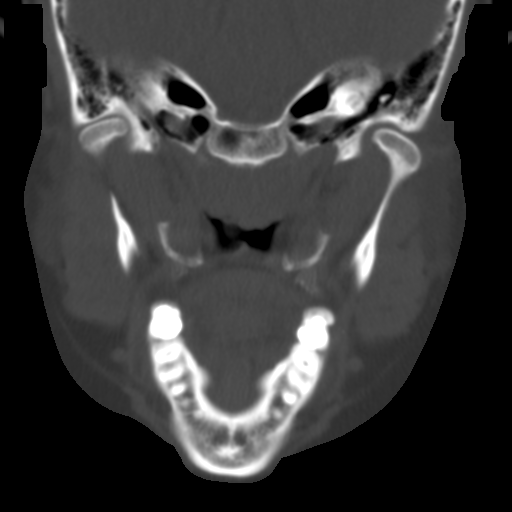
[im 3/13  bone]
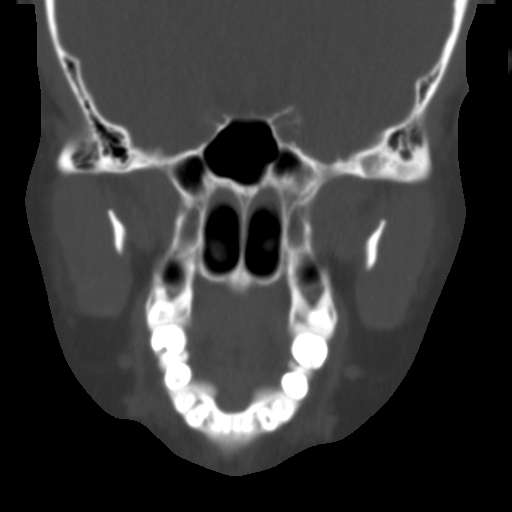
[im 4/13  bone]
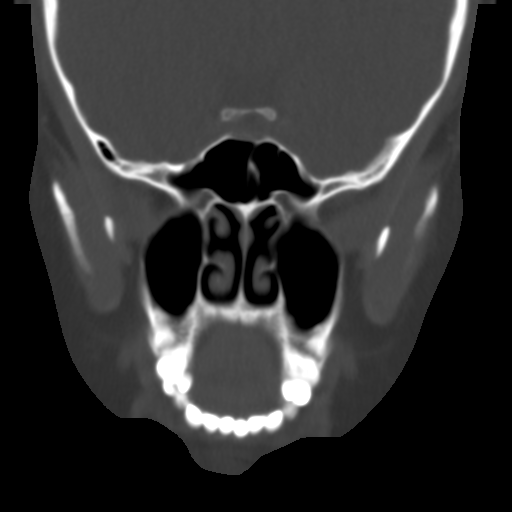
[im 5/13  bone]
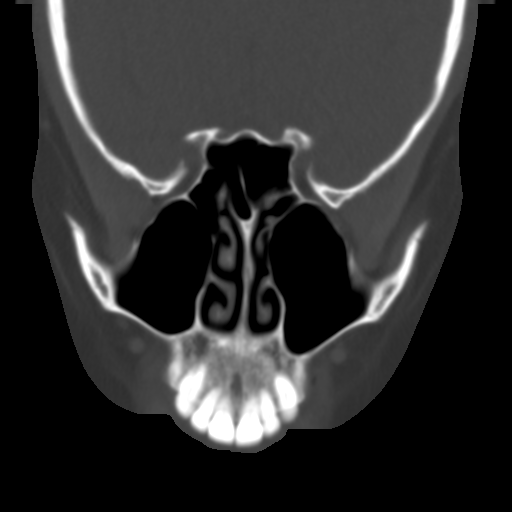
[im 6/13  brain]
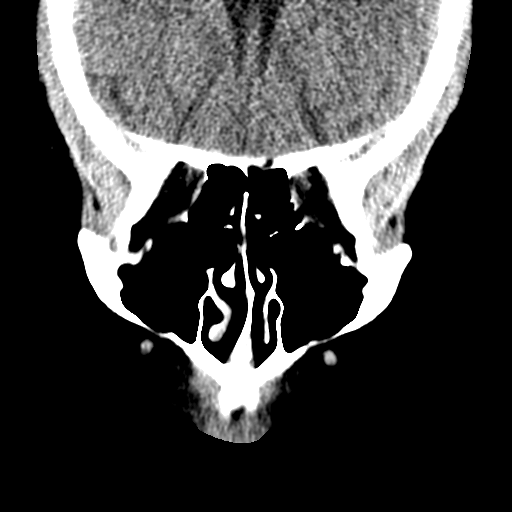
[im 6/13  bone]
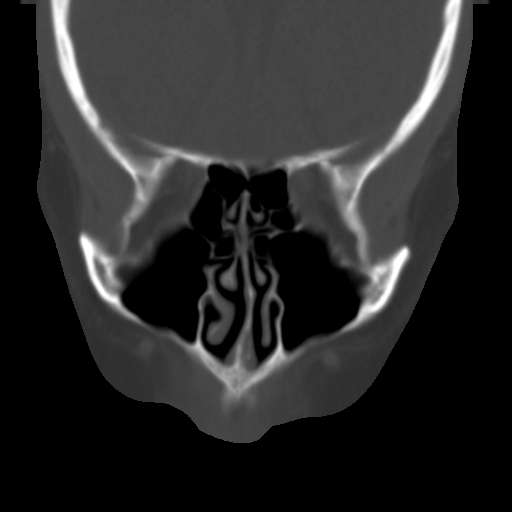
[im 7/13  bone]
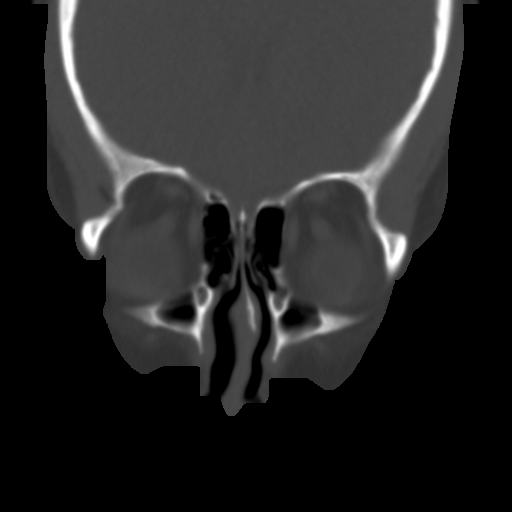
[im 8/13  bone]
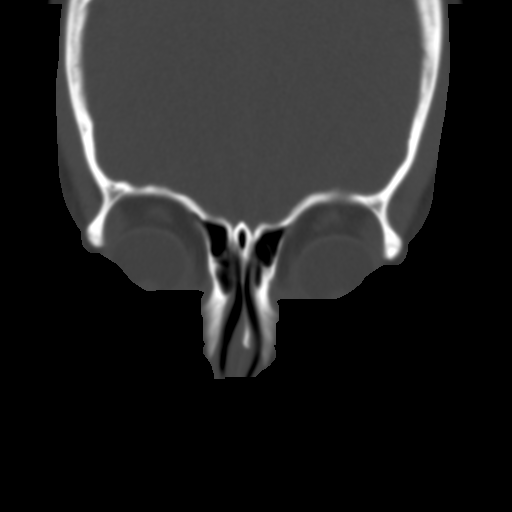
[im 9/13  bone]
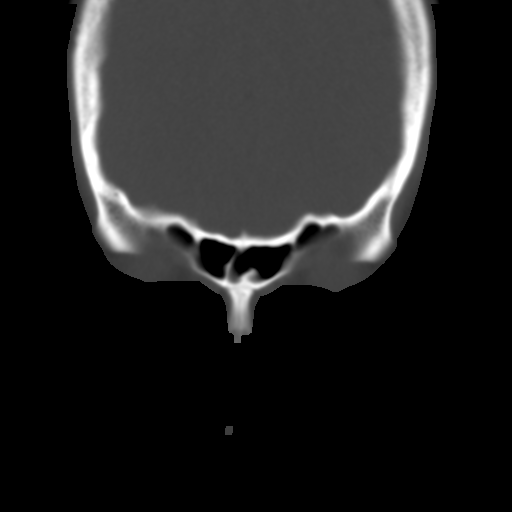
[im 10/13  brain]
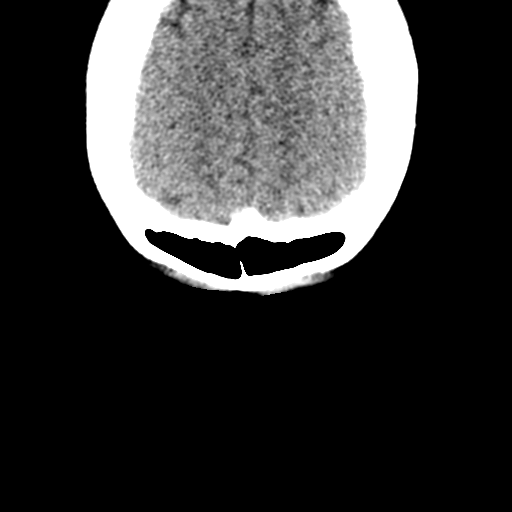
[im 10/13  bone]
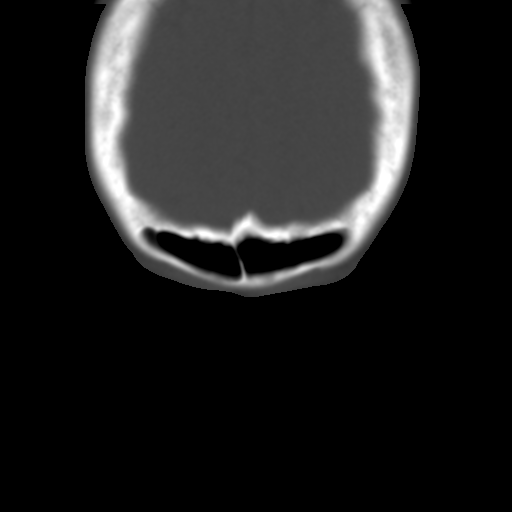
[im 11/13  bone]
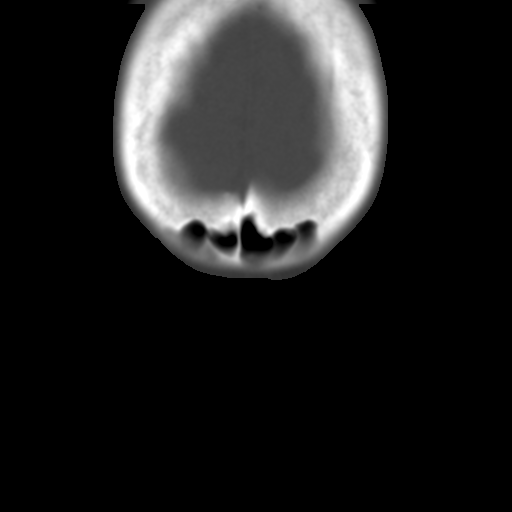
[im 12/13  bone]
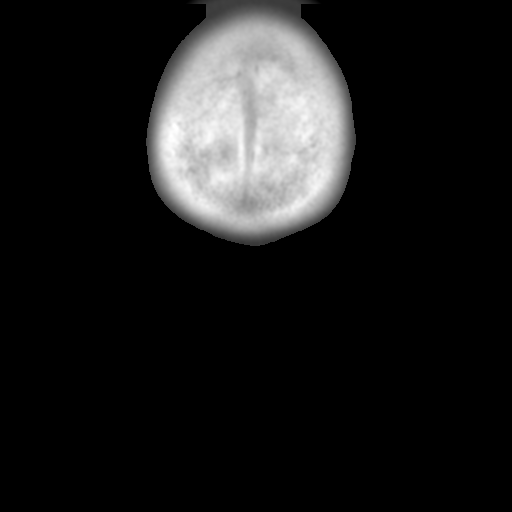

[11 of 13 positions shown; findings below may reference images not displayed]

FINDINGS: Paranasal sinuses clear. No significant mucosal edema. No air-fluid
level.

Nasal septum slightly deviated to the right. Nasal passage patent
bilaterally. Visualized mastoid sinus clear bilaterally.

Visualized soft tissues and intracranial contents negative.
IMPRESSION: Negative limited CT sinus
